# Patient Record
Sex: Female | Born: 1995 | Race: Black or African American | Hispanic: No | Marital: Single | Smoking: Never smoker
Health system: Southern US, Community
[De-identification: ages and names within clinical notes are randomized; demographics above are authoritative.]

## PROBLEM LIST (undated history)

## (undated) DIAGNOSIS — F3281 Premenstrual dysphoric disorder: Secondary | ICD-10-CM

## (undated) DIAGNOSIS — A6000 Herpesviral infection of urogenital system, unspecified: Secondary | ICD-10-CM

## (undated) DIAGNOSIS — E669 Obesity, unspecified: Secondary | ICD-10-CM

## (undated) DIAGNOSIS — Z8619 Personal history of other infectious and parasitic diseases: Secondary | ICD-10-CM

## (undated) HISTORY — DX: Herpesviral infection of urogenital system, unspecified: A60.00

## (undated) HISTORY — DX: Personal history of other infectious and parasitic diseases: Z86.19

---
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
## 2015-06-06 ENCOUNTER — Emergency Department (HOSPITAL_COMMUNITY)
Admission: EM | Admit: 2015-06-06 | Discharge: 2015-06-07 | Disposition: A | Discharge status: Home / Self Care | Payer: Medicaid Other | Attending: Emergency Medicine | Admitting: Emergency Medicine

## 2015-06-06 ENCOUNTER — Encounter (HOSPITAL_COMMUNITY): Payer: Self-pay

## 2015-06-06 DIAGNOSIS — Z3202 Encounter for pregnancy test, result negative: Secondary | ICD-10-CM | POA: Diagnosis not present

## 2015-06-06 DIAGNOSIS — R1110 Vomiting, unspecified: Secondary | ICD-10-CM | POA: Insufficient documentation

## 2015-06-06 DIAGNOSIS — J020 Streptococcal pharyngitis: Secondary | ICD-10-CM | POA: Diagnosis not present

## 2015-06-06 DIAGNOSIS — J029 Acute pharyngitis, unspecified: Secondary | ICD-10-CM | POA: Diagnosis present

## 2015-06-06 DIAGNOSIS — R109 Unspecified abdominal pain: Secondary | ICD-10-CM | POA: Insufficient documentation

## 2015-06-06 LAB — CBC
HCT: 43.5 % (ref 36.0–46.0)
Hemoglobin: 14.7 g/dL (ref 12.0–15.0)
MCH: 27.1 pg (ref 26.0–34.0)
MCHC: 33.8 g/dL (ref 30.0–36.0)
MCV: 80.1 fL (ref 78.0–100.0)
Platelets: 273 10*3/uL (ref 150–400)
RBC: 5.43 MIL/uL — ABNORMAL HIGH (ref 3.87–5.11)
RDW: 12.5 % (ref 11.5–15.5)
WBC: 5.6 10*3/uL (ref 4.0–10.5)

## 2015-06-06 LAB — URINE MICROSCOPIC-ADD ON

## 2015-06-06 LAB — COMPREHENSIVE METABOLIC PANEL
ALK PHOS: 53 U/L (ref 38–126)
ALT: 28 U/L (ref 14–54)
AST: 28 U/L (ref 15–41)
Albumin: 4.2 g/dL (ref 3.5–5.0)
Anion gap: 15 (ref 5–15)
BUN: 5 mg/dL — ABNORMAL LOW (ref 6–20)
CALCIUM: 9.9 mg/dL (ref 8.9–10.3)
CHLORIDE: 99 mmol/L — AB (ref 101–111)
CO2: 24 mmol/L (ref 22–32)
CREATININE: 0.69 mg/dL (ref 0.44–1.00)
GFR calc Af Amer: >60 mL/min (ref >60)
GFR calc non Af Amer: >60 mL/min (ref >60)
Glucose, Bld: 106 mg/dL — ABNORMAL HIGH (ref 65–99)
Potassium: 4.5 mmol/L (ref 3.5–5.1)
SODIUM: 138 mmol/L (ref 135–145)
Total Bilirubin: 0.7 mg/dL (ref 0.3–1.2)
Total Protein: 8.2 g/dL — ABNORMAL HIGH (ref 6.5–8.1)

## 2015-06-06 LAB — URINALYSIS, ROUTINE W REFLEX MICROSCOPIC
GLUCOSE, UA: NEGATIVE mg/dL
Ketones, ur: NEGATIVE mg/dL
Nitrite: NEGATIVE
PH: 6 (ref 5.0–8.0)
Protein, ur: NEGATIVE mg/dL
SPECIFIC GRAVITY, URINE: 1.023 (ref 1.005–1.030)

## 2015-06-06 LAB — POC URINE PREG, ED: PREG TEST UR: NEGATIVE

## 2015-06-06 LAB — LIPASE, BLOOD: Lipase: 21 U/L (ref 11–51)

## 2015-06-06 MED ORDER — ACETAMINOPHEN 500 MG PO TABS
1000.0000 mg | ORAL_TABLET | Freq: Once | ORAL | Status: AC
  Administered 2015-06-07: 1000 mg via ORAL
  Filled 2015-06-06: qty 2

## 2015-06-06 NOTE — ED Notes (Signed)
PA in room to assess patient's throat.

## 2015-06-06 NOTE — ED Provider Notes (Signed)
CSN: [id number]     Arrival date & time 06/06/15  2014 History   First MD Initiated Contact with Patient 06/06/15 2022     Chief Complaint  Patient presents with  . Emesis  . Sore Throat     (Consider location/radiation/quality/duration/timing/severity/associated sxs/prior Treatment) HPI Complains of sore throat and pain with swallowing and frontal headache gradual onset yesterday. She also complained of right upper quadrant pain today lasting one hour after eating an apple followed by vomiting 2 times. She is not nauseated presently she is presently hungry. Last bowel movement today., No diarrhea Other associated symptoms include mild cough. No shortness of breath. Nothing makes symptoms better or worse. Last menstrual period 3 weeks ago slightly lighter than normal. Denies vaginal discharge. History reviewed. No pertinent past medical history. History reviewed. No pertinent past surgical history. No family history on file. Social History  Substance Use Topics  . Smoking status: Never Smoker   . Smokeless tobacco: None  . Alcohol Use: None   OB History    No data available     Review of Systems  Constitutional: Negative.   HENT: Positive for sore throat.   Respiratory: Positive for cough.   Cardiovascular: Negative.   Gastrointestinal: Positive for vomiting and abdominal pain.  Musculoskeletal: Negative.   Skin: Negative.   Neurological: Negative.   Psychiatric/Behavioral: Negative.   All other systems reviewed and are negative.     Allergies  Review of patient's allergies indicates no known allergies.  Home Medications   Prior to Admission medications   Not on File   BP 139/77 mmHg  Pulse 95  Temp(Src) 98.4 F (36.9 C) (Oral)  Resp 22  SpO2 99%  LMP 05/16/2015 Physical Exam  Constitutional: She appears well-developed and well-nourished. No distress.  Laughing. No distress  HENT:  Head: Normocephalic and atraumatic.  Right Ear: External ear normal.  Left  Ear: External ear normal.  Mouth/Throat: No oropharyngeal exudate.  Oropharynx minimally reddened. No exudate uvula midline. Tonsils not large. Bilateral tympanic membranes normal no pain on manipulation of larynx  Eyes: Conjunctivae are normal. Pupils are equal, round, and reactive to light. Right eye exhibits no discharge. Left eye exhibits no discharge.  Neck: Neck supple. No tracheal deviation present. No thyromegaly present.  Cardiovascular: Normal rate and regular rhythm.   No murmur heard. Pulmonary/Chest: Effort normal and breath sounds normal.  Abdominal: Soft. Bowel sounds are normal. She exhibits no distension. There is no tenderness.  Morbidly obese  Musculoskeletal: Normal range of motion. She exhibits no edema or tenderness.  Lymphadenopathy:    She has no cervical adenopathy.  Neurological: She is alert. No cranial nerve deficit. She exhibits normal muscle tone. Coordination normal.  Gait normal  Skin: Skin is warm and dry. No rash noted.  Psychiatric: She has a normal mood and affect.  Nursing note and vitals reviewed.   ED Course  Procedures (including critical care time) Labs Review Labs Reviewed  COMPREHENSIVE METABOLIC PANEL - Abnormal; Notable for the following:    Chloride 99 (*)    Glucose, Bld 106 (*)    BUN 5 (*)    Total Protein 8.2 (*)    All other components within normal limits  CBC - Abnormal; Notable for the following:    RBC 5.43 (*)    All other components within normal limits  URINALYSIS, ROUTINE W REFLEX MICROSCOPIC (NOT AT [facility]) - Abnormal; Notable for the following:    Color, Urine AMBER (*)    APPearance CLOUDY (*)  Hgb urine dipstick TRACE (*)    Bilirubin Urine SMALL (*)    Leukocytes, UA SMALL (*)    All other components within normal limits  URINE MICROSCOPIC-ADD ON - Abnormal; Notable for the following:    Squamous Epithelial / LPF 6-30 (*)    Bacteria, UA FEW (*)    All other components within normal limits  LIPASE, BLOOD  POC  URINE PREG, ED    Imaging Review No results found. I have personally reviewed and evaluated these images and lab results as part of my medical decision-making.   EKG Interpretation None     12:55 AM patient states pain is improved after treatment with Tylenol. Results for orders placed or performed during the hospital encounter of 06/06/15  Rapid strep screen (not at [facility])  Result Value Ref Range   Streptococcus, Group A Screen (Direct) POSITIVE (A) NEGATIVE  Lipase, blood  Result Value Ref Range   Lipase 21 11 - 51 U/L  Comprehensive metabolic panel  Result Value Ref Range   Sodium 138 135 - 145 mmol/L   Potassium 4.5 3.5 - 5.1 mmol/L   Chloride 99 (L) 101 - 111 mmol/L   CO2 24 22 - 32 mmol/L   Glucose, Bld 106 (H) 65 - 99 mg/dL   BUN 5 (L) 6 - 20 mg/dL   Creatinine, Ser 1.610.69 0.44 - 1.00 mg/dL   Calcium 9.9 8.9 - 09.610.3 mg/dL   Total Protein 8.2 (H) 6.5 - 8.1 g/dL   Albumin 4.2 3.5 - 5.0 g/dL   AST 28 15 - 41 U/L   ALT 28 14 - 54 U/L   Alkaline Phosphatase 53 38 - 126 U/L   Total Bilirubin 0.7 0.3 - 1.2 mg/dL   GFR calc non Af Amer >60 >60 mL/min   GFR calc Af Amer >60 >60 mL/min   Anion gap 15 5 - 15  CBC  Result Value Ref Range   WBC 5.6 4.0 - 10.5 K/uL   RBC 5.43 (H) 3.87 - 5.11 MIL/uL   Hemoglobin 14.7 12.0 - 15.0 g/dL   HCT 04.543.5 40.936.0 - 81.146.0 %   MCV 80.1 78.0 - 100.0 fL   MCH 27.1 26.0 - 34.0 pg   MCHC 33.8 30.0 - 36.0 g/dL   RDW 91.412.5 78.211.5 - 95.615.5 %   Platelets 273 150 - 400 K/uL  Urinalysis, Routine w reflex microscopic (not at [facility])  Result Value Ref Range   Color, Urine AMBER (A) YELLOW   APPearance CLOUDY (A) CLEAR   Specific Gravity, Urine 1.023 1.005 - 1.030   pH 6.0 5.0 - 8.0   Glucose, UA NEGATIVE NEGATIVE mg/dL   Hgb urine dipstick TRACE (A) NEGATIVE   Bilirubin Urine SMALL (A) NEGATIVE   Ketones, ur NEGATIVE NEGATIVE mg/dL   Protein, ur NEGATIVE NEGATIVE mg/dL   Nitrite NEGATIVE NEGATIVE   Leukocytes, UA SMALL (A) NEGATIVE  Urine  microscopic-add on  Result Value Ref Range   Squamous Epithelial / LPF 6-30 (A) NONE SEEN   WBC, UA 0-5 0 - 5 WBC/hpf   RBC / HPF 0-5 0 - 5 RBC/hpf   Bacteria, UA FEW (A) NONE SEEN  POC Urine Pregnancy, ED (do NOT order at [facility])  Result Value Ref Range   Preg Test, Ur NEGATIVE NEGATIVE   No results found.  MDM  In light of positive rapid strep screen will treat with Pen-Vee K. Prescription written. First dose of penicillin administered prior to discharge. Plan Tylenol for pain. Referral to  [facility] to get primary care physician Diagnosis strep pharyngitis Final diagnoses:  None        Doug Sou, MD 06/07/15 0120

## 2015-06-06 NOTE — ED Notes (Signed)
Pt reports HA, sore throat and emesis x 2 today after eating an apple.

## 2015-06-06 NOTE — ED Notes (Signed)
Pt now reporting she thinks she might be pregnant and also reports abdominal pain.

## 2015-06-07 LAB — RAPID STREP SCREEN (MED CTR MEBANE ONLY): Streptococcus, Group A Screen (Direct): POSITIVE — AB

## 2015-06-07 MED ORDER — PENICILLIN V POTASSIUM 250 MG PO TABS
500.0000 mg | ORAL_TABLET | Freq: Once | ORAL | Status: AC
  Administered 2015-06-07: 500 mg via ORAL
  Filled 2015-06-07: qty 2

## 2015-06-07 MED ORDER — PENICILLIN V POTASSIUM 500 MG PO TABS: 500.0000 mg | ORAL_TABLET | Freq: Four times a day (QID) | ORAL | Status: AC

## 2015-06-07 NOTE — Discharge Instructions (Signed)
Pharyngitis Make sure that you finish the penicillin prescribed. Take Tylenol as directed for pain. [facility] to get a primary care physician. Return if you feel worse for any reason or are unable to hold down the medications prescribed without vomiting. Pharyngitis is redness, pain, and swelling (inflammation) of your pharynx.  CAUSES  Pharyngitis is usually caused by infection. Most of the time, these infections are from viruses (viral) and are part of a cold. However, sometimes pharyngitis is caused by bacteria (bacterial). Pharyngitis can also be caused by allergies. Viral pharyngitis may be spread from person to person by coughing, sneezing, and personal items or utensils (cups, forks, spoons, toothbrushes). Bacterial pharyngitis may be spread from person to person by more intimate contact, such as kissing.  SIGNS AND SYMPTOMS  Symptoms of pharyngitis include:   Sore throat.   Tiredness (fatigue).   Low-grade fever.   Headache.  Joint pain and muscle aches.  Skin rashes.  Swollen lymph nodes.  Plaque-like film on throat or tonsils (often seen with bacterial pharyngitis). DIAGNOSIS  Your health care provider will ask you questions about your illness and your symptoms. Your medical history, along with a physical exam, is often all that is needed to diagnose pharyngitis. Sometimes, a rapid strep test is done. Other lab tests may also be done, depending on the suspected cause.  TREATMENT  Viral pharyngitis will usually get better in 3-4 days without the use of medicine. Bacterial pharyngitis is treated with medicines that kill germs (antibiotics).  HOME CARE INSTRUCTIONS   Drink enough water and fluids to keep your urine clear or pale yellow.   Only take over-the-counter or prescription medicines as directed by your health care provider:   If you are prescribed antibiotics, make sure you finish them even if you start to feel better.   Do  not take aspirin.   Get lots of rest.   Gargle with 8 oz of salt water ( tsp of salt per 1 qt of water) as often as every 1-2 hours to soothe your throat.   Throat lozenges (if you are not at risk for choking) or sprays may be used to soothe your throat. SEEK MEDICAL CARE IF:   You have large, tender lumps in your neck.  You have a rash.  You cough up green, yellow-brown, or bloody spit. SEEK IMMEDIATE MEDICAL CARE IF:   Your neck becomes stiff.  You drool or are unable to swallow liquids.  You vomit or are unable to keep medicines or liquids down.  You have severe pain that does not go away with the use of recommended medicines.  You have trouble breathing (not caused by a stuffy nose). MAKE SURE YOU:   Understand these instructions.  Will watch your condition.  Will get help right away if you are not doing well or get worse.   This information is not intended to replace advice given to you by your health care provider. Make sure you discuss any questions you have with your health care provider.   Document Released: 03/22/2005 Document Revised: 01/10/2013 Document Reviewed: 11/27/2012 Elsevier Interactive Patient Education [employer]

## 2015-06-07 NOTE — ED Notes (Signed)
Pt departed in NAD.  

## 2015-10-07 ENCOUNTER — Ambulatory Visit (HOSPITAL_COMMUNITY)
Admission: EM | Admit: 2015-10-07 | Discharge: 2015-10-07 | Disposition: A | Discharge status: Home / Self Care | Payer: Medicaid Other | Attending: Family Medicine | Admitting: Family Medicine

## 2015-10-07 ENCOUNTER — Encounter (HOSPITAL_COMMUNITY): Payer: Self-pay | Admitting: Emergency Medicine

## 2015-10-07 DIAGNOSIS — J340 Abscess, furuncle and carbuncle of nose: Secondary | ICD-10-CM

## 2015-10-07 MED ORDER — CEPHALEXIN 500 MG PO CAPS: 500.0000 mg | ORAL_CAPSULE | Freq: Four times a day (QID) | ORAL | Status: DC

## 2015-10-07 NOTE — ED Provider Notes (Signed)
CSN: [id number]     Arrival date & time 10/07/15  1825 History   None    Chief Complaint  Patient presents with  . Foreign Body in Nose   (Consider location/radiation/quality/duration/timing/severity/associated sxs/prior Treatment) Patient is a 20 y.o. female presenting with foreign body in nose. The history is provided by the patient.  Foreign Body in Nose This is a new problem. The current episode started 2 days ago. The problem occurs constantly. The problem has not changed since onset.Nothing aggravates the symptoms. Nothing relieves the symptoms. She has tried nothing for the symptoms.    No past medical history on file. No past surgical history on file. No family history on file. Social History  Substance Use Topics  . Smoking status: Never Smoker   . Smokeless tobacco: Not on file  . Alcohol Use: Not on file   OB History    No data available     Review of Systems  Constitutional: Negative.   HENT: Negative.   Eyes: Negative.   Respiratory: Negative.   Cardiovascular: Negative.   Gastrointestinal: Negative.   Endocrine: Negative.   Genitourinary: Negative.   Musculoskeletal: Negative.   Skin: Positive for wound.  Allergic/Immunologic: Negative.   Neurological: Negative.   Hematological: Negative.   Psychiatric/Behavioral: Negative.     Allergies  Review of patient's allergies indicates no known allergies.  Home Medications   Prior to Admission medications   Not on File   Meds Ordered and Administered this Visit  Medications - No data to display  There were no vitals taken for this visit. No data found.   Physical Exam  Constitutional: She appears well-developed and well-nourished.  HENT:  Head: Normocephalic.  Right Ear: External ear normal.  Left Ear: External ear normal.  Mouth/Throat: Oropharynx is clear and moist.  Eyes: Conjunctivae are normal. Pupils are equal, round, and reactive to light.  Neck: Normal range of motion. Neck supple.   Cardiovascular: Normal rate, regular rhythm and normal heart sounds.   Pulmonary/Chest: Effort normal and breath sounds normal.  Skin:  Nose piercing metal piece removed from left nare.  Left nare with swelling and erythema and tenderness.    ED Course  Procedures (including critical care time)  Labs Review Labs Reviewed - No data to display  Imaging Review No results found.   Visual Acuity Review  Right Eye Distance:   Left Eye Distance:   Bilateral Distance:    Right Eye Near:   Left Eye Near:    Bilateral Near:         MDM   Nasal cellulitis - Keflex 500mg  one po qid x 10 days  Left nasal ring device is removed and patient advised not to replace the nose ring due to infection.  Deatra CanterWilliam J Oxford, FNP 10/07/15 [phone]

## 2015-10-07 NOTE — Discharge Instructions (Signed)
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
## 2016-08-12 ENCOUNTER — Emergency Department (HOSPITAL_COMMUNITY)
Admission: EM | Admit: 2016-08-12 | Discharge: 2016-08-12 | Disposition: A | Discharge status: Home / Self Care | Payer: No Typology Code available for payment source | Attending: Emergency Medicine | Admitting: Emergency Medicine

## 2016-08-12 ENCOUNTER — Emergency Department (HOSPITAL_COMMUNITY): Payer: No Typology Code available for payment source

## 2016-08-12 ENCOUNTER — Encounter (HOSPITAL_COMMUNITY): Payer: Self-pay | Admitting: Nurse Practitioner

## 2016-08-12 DIAGNOSIS — Y939 Activity, unspecified: Secondary | ICD-10-CM | POA: Insufficient documentation

## 2016-08-12 DIAGNOSIS — S3992XA Unspecified injury of lower back, initial encounter: Secondary | ICD-10-CM | POA: Diagnosis present

## 2016-08-12 DIAGNOSIS — S39012A Strain of muscle, fascia and tendon of lower back, initial encounter: Secondary | ICD-10-CM

## 2016-08-12 DIAGNOSIS — Y92410 Unspecified street and highway as the place of occurrence of the external cause: Secondary | ICD-10-CM | POA: Insufficient documentation

## 2016-08-12 DIAGNOSIS — Y999 Unspecified external cause status: Secondary | ICD-10-CM | POA: Diagnosis not present

## 2016-08-12 LAB — I-STAT BETA HCG BLOOD, ED (MC, WL, AP ONLY): I-stat hCG, quantitative: <5 m[IU]/mL (ref <5)

## 2016-08-12 MED ORDER — NAPROXEN 500 MG PO TABS: 500.0000 mg | ORAL_TABLET | Freq: Two times a day (BID) | ORAL | 0 refills | Status: DC

## 2016-08-12 MED ORDER — HYDROCODONE-ACETAMINOPHEN 5-325 MG PO TABS: 1.0000 | ORAL_TABLET | Freq: Four times a day (QID) | ORAL | 0 refills | Status: DC | PRN
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
## 2017-01-06 ENCOUNTER — Emergency Department (HOSPITAL_COMMUNITY)
Admission: EM | Admit: 2017-01-06 | Discharge: 2017-01-06 | Disposition: A | Discharge status: Home / Self Care | Payer: BLUE CROSS/BLUE SHIELD | Attending: Emergency Medicine | Admitting: Emergency Medicine

## 2017-01-06 ENCOUNTER — Encounter (HOSPITAL_COMMUNITY): Payer: Self-pay | Admitting: Emergency Medicine

## 2017-01-06 ENCOUNTER — Ambulatory Visit (HOSPITAL_COMMUNITY)
Admission: EM | Admit: 2017-01-06 | Discharge: 2017-01-06 | Disposition: A | Discharge status: Home / Self Care | Payer: BLUE CROSS/BLUE SHIELD

## 2017-01-06 DIAGNOSIS — R51 Headache: Secondary | ICD-10-CM

## 2017-01-06 DIAGNOSIS — G43009 Migraine without aura, not intractable, without status migrainosus: Secondary | ICD-10-CM

## 2017-01-06 DIAGNOSIS — G43909 Migraine, unspecified, not intractable, without status migrainosus: Secondary | ICD-10-CM | POA: Diagnosis not present

## 2017-01-06 DIAGNOSIS — R519 Headache, unspecified: Secondary | ICD-10-CM

## 2017-01-06 MED ORDER — PROCHLORPERAZINE EDISYLATE 5 MG/ML IJ SOLN
10.0000 mg | Freq: Once | INTRAMUSCULAR | Status: AC
  Administered 2017-01-06: 10 mg via INTRAVENOUS
  Filled 2017-01-06: qty 2

## 2017-01-06 MED ORDER — SODIUM CHLORIDE 0.9 % IV BOLUS (SEPSIS)
1000.0000 mL | Freq: Once | INTRAVENOUS | Status: AC
  Administered 2017-01-06: 1000 mL via INTRAVENOUS

## 2017-01-06 MED ORDER — KETOROLAC TROMETHAMINE 30 MG/ML IJ SOLN
30.0000 mg | Freq: Once | INTRAMUSCULAR | Status: AC
  Administered 2017-01-06: 30 mg via INTRAVENOUS
  Filled 2017-01-06: qty 1

## 2017-01-06 MED ORDER — DIPHENHYDRAMINE HCL 50 MG/ML IJ SOLN
25.0000 mg | Freq: Once | INTRAMUSCULAR | Status: AC
  Administered 2017-01-06: 25 mg via INTRAVENOUS
  Filled 2017-01-06: qty 1

## 2017-01-06 NOTE — ED Triage Notes (Signed)
Pt reports a headache that started at 1130 this morning.  After work she took a Midol and tried to lie down but she states she vomited not long after taking the medicine.  She reports some right eye blurriness when the headache started but that has subsided.  She states that her head is pounding.

## 2017-01-06 NOTE — ED Triage Notes (Signed)
Pt to ER from [facility] for further evaluation of headache. States started at 11:30 this morning, states primarily frontal headache with radiation to right eye, states earlier today experienced blurred vision and facial tingling. Pt at present time is neurologically intact, no deficits noted. NAD. Ambulatory.

## 2017-01-06 NOTE — ED Provider Notes (Signed)
[facility] Provider Note   CSN: [id number] Arrival date & time: 01/06/17  1650     History   Chief Complaint Chief Complaint  Patient presents with  . Headache    HPI Kelly Mcdaniel is a 21 y.o. female.  HPI Patient presents to the emergency department with migraine headache started earlier today.  The patient states she feels the headache, subjectively a little bit more intense than normal.  She states she did not take any medications other than Midol prior to arrival which she vomited shortly thereafter.  Patient states that nothing seems make the condition better or worse.  She states sounds earlier did make the headache, a little bit worse.  Patient states that she was seen at urgent care and they sent her here for further evaluation.  Patient denies blurred vision, nausea, weakness, dizziness, numbness, back pain, neck pain, chest pain, shortness of breath, abdominal pain or syncope History reviewed. No pertinent past medical history.  There are no active problems to display for this patient.   History reviewed. No pertinent surgical history.  OB History    No data available       Home Medications    Prior to Admission medications   Medication Sig Start Date End Date Taking? Authorizing Provider  Acetaminophen-Caff-Pyrilamine (MIDOL COMPLETE) 500-60-15 MG TABS Take 1-2 tablets by mouth every 6 (six) hours as needed (for pain or discomfort).   Yes [provider]  cephALEXin (KEFLEX) 500 MG capsule Take 1 capsule (500 mg total) by mouth 4 (four) times daily. Patient not taking: Reported on 01/06/2017 10/07/15   Deatra Canter, FNP  HYDROcodone-acetaminophen (NORCO/VICODIN) 5-325 MG tablet Take 1-2 tablets by mouth every 6 (six) hours as needed for moderate pain. Patient not taking: Reported on 01/06/2017 08/12/16   Vanetta Mulders, MD  naproxen (NAPROSYN) 500 MG tablet Take 1 tablet (500 mg total) by mouth 2 (two) times daily. Patient not taking:  Reported on 01/06/2017 08/12/16   Vanetta Mulders, MD    Family History No family history on file.  Social History Social History  Substance Use Topics  . Smoking status: Never Smoker  . Smokeless tobacco: Never Used  . Alcohol use Yes     Allergies   Patient has no known allergies.   Review of Systems Review of Systems  All other systems negative except as documented in the HPI. All pertinent positives and negatives as reviewed in the HPI. Physical Exam Updated Vital Signs BP 111/76   Pulse 73   Temp 98.3 F (36.8 C) (Oral)   Resp 17   LMP 12/22/2016 (Approximate)   SpO2 100%   Physical Exam  Constitutional: She is oriented to person, place, and time. She appears well-developed and well-nourished. No distress.  HENT:  Head: Normocephalic and atraumatic.  Mouth/Throat: Oropharynx is clear and moist.  Eyes: Pupils are equal, round, and reactive to light.  Neck: Normal range of motion. Neck supple.  Cardiovascular: Normal rate, regular rhythm and normal heart sounds.  Exam reveals no gallop and no friction rub.   No murmur heard. Pulmonary/Chest: Effort normal and breath sounds normal. No respiratory distress. She has no wheezes.  Neurological: She is alert and oriented to person, place, and time. She has normal strength. No sensory deficit. She exhibits normal muscle tone. Coordination and gait normal.  Skin: Skin is warm and dry. Capillary refill takes less than 2 seconds. No rash noted. No erythema.  Psychiatric: She has a normal mood and affect. Her behavior  is normal.  Nursing note and vitals reviewed.    ED Treatments / Results  Labs (all labs ordered are listed, but only abnormal results are displayed) Labs Reviewed - No data to display  EKG  EKG Interpretation None       Radiology No results found.  Procedures Procedures (including critical care time)  Medications Ordered in ED Medications  ketorolac (TORADOL) 30 MG/ML injection 30 mg (30 mg  Intravenous Given 01/06/17 2142)  diphenhydrAMINE (BENADRYL) injection 25 mg (25 mg Intravenous Given 01/06/17 2142)  prochlorperazine (COMPAZINE) injection 10 mg (10 mg Intravenous Given 01/06/17 2142)  sodium chloride 0.9 % bolus 1,000 mL (0 mLs Intravenous Stopped 01/06/17 2240)     Initial Impression / Assessment and Plan / ED Course  I have reviewed the triage vital signs and the nursing notes.  Pertinent labs & imaging results that were available during my care of the patient were reviewed by me and considered in my medical decision making (see chart for details).   .  I feel that the patient's symptoms are being caused by migraine headache based on her history of present illness and physical exam findings.  Patient states that she is feeling better at this time following IV medications and IV fluids.  Patient is advised return here as needed.  I did advise her to follow-up with her primary care doctor.  She has no neurological deficits noted on exam. Final Clinical Impressions(s) / ED Diagnoses   Final diagnoses:  None    New Prescriptions New Prescriptions   No medications on file     Charlestine Night, Cordelia Poche 01/06/17 2311    Tegeler, Canary Brim, MD 01/06/17 2322

## 2017-01-06 NOTE — Discharge Instructions (Signed)
Return here as needed.  Follow-up with your primary doctor.  Return here as needed.

## 2017-01-06 NOTE — ED Provider Notes (Signed)
[facility]    CSN: [id number] Arrival date & time: 01/06/17  1438     History   Chief Complaint Chief Complaint  Patient presents with  . Headache    HPI Kelly Mcdaniel is a 21 y.o. female.   HPI: 21 y/o female presented to clinic with CC of headache which started this morning. Pt reports headache associated with blurry vision in rt eye and rt sided facial numbness. Blurry vision lasted for about 40-45 minutes and resolved, numbness for 3-5 min. Pt had one episode o vomit proceeding headache. Denies photo or phono phobia.  Pt states worst headache of life. Neurologically Intact, speech clear, gait steady. No neurological deficit appreciated. Pt A&Ox3. History provided by Pt.  History reviewed. No pertinent past medical history.  There are no active problems to display for this patient.   History reviewed. No pertinent surgical history.  OB History    No data available       Home Medications    Prior to Admission medications   Medication Sig Start Date End Date Taking? Authorizing Provider  cephALEXin (KEFLEX) 500 MG capsule Take 1 capsule (500 mg total) by mouth 4 (four) times daily. 10/07/15   Deatra Canter, FNP  HYDROcodone-acetaminophen (NORCO/VICODIN) 5-325 MG tablet Take 1-2 tablets by mouth every 6 (six) hours as needed for moderate pain. 08/12/16   Vanetta Mulders, MD  naproxen (NAPROSYN) 500 MG tablet Take 1 tablet (500 mg total) by mouth 2 (two) times daily. 08/12/16   Vanetta Mulders, MD    Family History History reviewed. No pertinent family history.  Social History Social History  Substance Use Topics  . Smoking status: Never Smoker  . Smokeless tobacco: Never Used  . Alcohol use Yes     Allergies   Patient has no known allergies.   Review of Systems Review of Systems  Constitutional: Negative.   HENT: Negative.   Eyes: Negative.   Respiratory: Negative.   Cardiovascular: Negative.   Neurological: Positive for headaches.  Negative for dizziness, tremors, seizures, syncope, facial asymmetry, speech difficulty, weakness and numbness.     Physical Exam Triage Vital Signs ED Triage Vitals  Enc Vitals Group     BP 01/06/17 1537 130/79     Pulse Rate 01/06/17 1537 79     Resp --      Temp 01/06/17 1537 98.2 F (36.8 C)     Temp Source 01/06/17 1537 Oral     SpO2 01/06/17 1537 100 %     Weight --      Height --      Head Circumference --      Peak Flow --      Pain Score 01/06/17 1538 10     Pain Loc --      Pain Edu? --      Excl. in GC? --    No data found.   Updated Vital Signs BP 130/79 (BP Location: Left Arm)   Pulse 79   Temp 98.2 F (36.8 C) (Oral)   LMP 12/22/2016 (Approximate)   SpO2 100%   Visual Acuity Right Eye Distance:   Left Eye Distance:   Bilateral Distance:    Right Eye Near:   Left Eye Near:    Bilateral Near:     Physical Exam  Constitutional: She is oriented to person, place, and time. She appears well-developed and well-nourished. No distress.  HENT:  Head: Normocephalic.  Eyes: Pupils are equal, round, and reactive to light. Conjunctivae and  EOM are normal.  Neck: Normal range of motion.  Cardiovascular: Normal rate, regular rhythm and normal heart sounds.   Pulmonary/Chest: Effort normal and breath sounds normal.  Musculoskeletal: Normal range of motion.  Neurological: She is alert and oriented to person, place, and time. No cranial nerve deficit or sensory deficit. Coordination normal.  Skin: Skin is warm. She is not diaphoretic.     UC Treatments / Results  Labs (all labs ordered are listed, but only abnormal results are displayed) Labs Reviewed - No data to display  EKG  EKG Interpretation None       Radiology No results found.  Procedures Procedures (including critical care time)  Medications Ordered in UC Medications - No data to display   Initial Impression / Assessment and Plan / UC Course  I have reviewed the triage vital signs  and the nursing notes.  Pertinent labs & imaging results that were available during my care of the patient were reviewed by me and considered in my medical decision making (see chart for details).    Pt referred to ED for further evaluation and management.   Final Clinical Impressions(s) / UC Diagnoses   Final diagnoses:  Acute intractable headache, unspecified headache type    New Prescriptions New Prescriptions   No medications on file     Controlled Substance Prescriptions [place] Controlled Substance Registry consulted? No   Staisha Winiarski, NP 01/06/17 1640

## 2017-01-06 NOTE — Discharge Instructions (Signed)
Pt to ED for further evaluation and management  °

## 2017-02-22 ENCOUNTER — Ambulatory Visit: Payer: Self-pay | Admitting: Family Medicine

## 2017-06-08 ENCOUNTER — Ambulatory Visit (HOSPITAL_COMMUNITY)
Admission: EM | Admit: 2017-06-08 | Discharge: 2017-06-08 | Disposition: A | Discharge status: Home / Self Care | Payer: BLUE CROSS/BLUE SHIELD | Attending: Family Medicine | Admitting: Family Medicine

## 2017-06-08 ENCOUNTER — Encounter (HOSPITAL_COMMUNITY): Payer: Self-pay | Admitting: Emergency Medicine

## 2017-06-08 DIAGNOSIS — R300 Dysuria: Secondary | ICD-10-CM

## 2017-06-08 DIAGNOSIS — N3001 Acute cystitis with hematuria: Secondary | ICD-10-CM | POA: Diagnosis not present

## 2017-06-08 DIAGNOSIS — N390 Urinary tract infection, site not specified: Secondary | ICD-10-CM | POA: Diagnosis not present

## 2017-06-08 DIAGNOSIS — Z8619 Personal history of other infectious and parasitic diseases: Secondary | ICD-10-CM | POA: Diagnosis not present

## 2017-06-08 DIAGNOSIS — Z113 Encounter for screening for infections with a predominantly sexual mode of transmission: Secondary | ICD-10-CM | POA: Diagnosis not present

## 2017-06-08 DIAGNOSIS — A6009 Herpesviral infection of other urogenital tract: Secondary | ICD-10-CM | POA: Diagnosis not present

## 2017-06-08 LAB — POCT URINALYSIS DIP (DEVICE)
Glucose, UA: NEGATIVE mg/dL
Ketones, ur: 15 mg/dL — AB
Leukocytes, UA: NEGATIVE
Nitrite: NEGATIVE
Protein, ur: 100 mg/dL — AB
SPECIFIC GRAVITY, URINE: 1.025 (ref 1.005–1.030)
Urobilinogen, UA: 4 mg/dL — ABNORMAL HIGH (ref 0.0–1.0)
pH: 6.5 (ref 5.0–8.0)

## 2017-06-08 LAB — POCT PREGNANCY, URINE: Preg Test, Ur: NEGATIVE

## 2017-06-08 MED ORDER — NITROFURANTOIN MONOHYD MACRO 100 MG PO CAPS: 100.0000 mg | ORAL_CAPSULE | Freq: Two times a day (BID) | ORAL | 0 refills | Status: AC

## 2017-06-08 NOTE — Discharge Instructions (Signed)
We are treating you with Macrobid- every 12 hours for 5 days. Be sure to take full course. Stay hydrated- urine should be pale yellow to clear. My continue azo for relief of burning while infection is being cleared.   We are sending urine off to confirm UTI and caginal swab for STD's/Yeast/BV as cause of irritation.  Please return or follow up with your primary provider if symptoms not improving with treatment. Please return sooner if you have worsening of symptoms or develop fever, nausea, vomiting, abdominal pain, back pain, lightheadedness, dizziness.

## 2017-06-08 NOTE — ED Triage Notes (Signed)
PT reports dysuria since Sunday.  

## 2017-06-08 NOTE — ED Provider Notes (Signed)
[facility]    CSN: [id number] Arrival date & time: 06/08/17  1247     History   Chief Complaint Chief Complaint  Patient presents with  . Urinary Tract Infection    HPI Kelly Mcdaniel is a 22 y.o. female no contributing past medical history presenting today with dysuria.  Patient has had irritation with urinating, increased frequency, sensation of incomplete voiding for 3 days.  She took Azo on Monday and Tuesday.  She denies any vaginal discharge, vaginal irritation/itching or odor.  She states that the burning sensation comes from inside the urethra and not on the outside.  She denies fever, nausea, vomiting, abdominal pain.  She is having lower back pain.  Denies history of kidney stones.  Also is noting minor bleeding with wiping near urethra.  Last menstrual period was around February 20.  She is not on birth control.  Patient is sexually active, denies any new partners.  HPI  History reviewed. No pertinent past medical history.  There are no active problems to display for this patient.   History reviewed. No pertinent surgical history.  OB History    No data available       Home Medications    Prior to Admission medications   Medication Sig Start Date End Date Taking? Authorizing Provider  Acetaminophen-Caff-Pyrilamine (MIDOL COMPLETE) 500-60-15 MG TABS Take 1-2 tablets by mouth every 6 (six) hours as needed (for pain or discomfort).    [provider]  cephALEXin (KEFLEX) 500 MG capsule Take 1 capsule (500 mg total) by mouth 4 (four) times daily. Patient not taking: Reported on 01/06/2017 10/07/15   Deatra Canterxford, William J, FNP  HYDROcodone-acetaminophen (NORCO/VICODIN) 5-325 MG tablet Take 1-2 tablets by mouth every 6 (six) hours as needed for moderate pain. Patient not taking: Reported on 01/06/2017 08/12/16   Vanetta MuldersZackowski, Scott, MD  naproxen (NAPROSYN) 500 MG tablet Take 1 tablet (500 mg total) by mouth 2 (two) times daily. Patient not taking: Reported on  01/06/2017 08/12/16   Vanetta MuldersZackowski, Scott, MD  nitrofurantoin, macrocrystal-monohydrate, (MACROBID) 100 MG capsule Take 1 capsule (100 mg total) by mouth 2 (two) times daily for 5 days. 06/08/17 06/13/17  Baylynn Shifflett, Junius CreamerHallie C, PA-C    Family History No family history on file.  Social History Social History   Tobacco Use  . Smoking status: Never Smoker  . Smokeless tobacco: Never Used  Substance Use Topics  . Alcohol use: Yes  . Drug use: No     Allergies   Patient has no known allergies.   Review of Systems Review of Systems  Constitutional: Negative for fever.  Respiratory: Negative for shortness of breath.   Cardiovascular: Negative for chest pain.  Gastrointestinal: Negative for abdominal pain, diarrhea, nausea and vomiting.  Genitourinary: Positive for dysuria and frequency. Negative for flank pain, genital sores, hematuria, menstrual problem, vaginal bleeding, vaginal discharge and vaginal pain.  Musculoskeletal: Negative for back pain.  Skin: Negative for rash.  Neurological: Negative for dizziness, light-headedness and headaches.     Physical Exam Triage Vital Signs ED Triage Vitals  Enc Vitals Group     BP 06/08/17 1316 121/78     Pulse Rate 06/08/17 1315 94     Resp 06/08/17 1315 16     Temp 06/08/17 1315 98.7 F (37.1 C)     Temp Source 06/08/17 1315 Oral     SpO2 06/08/17 1315 98 %     Weight 06/08/17 1314 280 lb (127 kg)     Height --  Head Circumference --      Peak Flow --      Pain Score 06/08/17 1314 8     Pain Loc --      Pain Edu? --      Excl. in GC? --    No data found.  Updated Vital Signs BP 121/78   Pulse 94   Temp 98.7 F (37.1 C) (Oral)   Resp 16   Wt 280 lb (127 kg)   LMP 05/25/2017   SpO2 98%   Visual Acuity Right Eye Distance:   Left Eye Distance:   Bilateral Distance:    Right Eye Near:   Left Eye Near:    Bilateral Near:     Physical Exam  Constitutional: She appears well-developed and well-nourished. No distress.    HENT:  Head: Normocephalic and atraumatic.  Eyes: Conjunctivae are normal.  Neck: Neck supple.  Cardiovascular: Normal rate and regular rhythm.  No murmur heard. Pulmonary/Chest: Effort normal and breath sounds normal. No respiratory distress.  Breathing comfortably at rest  Abdominal: Soft. There is no tenderness.  Abdomen is soft, nontender to light and deep palpation.  No CVA tenderness.  Genitourinary:  Genitourinary Comments: Pelvic exam deferred  Musculoskeletal: She exhibits no edema.  Neurological: She is alert.  Skin: Skin is warm and dry.  Psychiatric: She has a normal mood and affect.  Nursing note and vitals reviewed.    UC Treatments / Results  Labs (all labs ordered are listed, but only abnormal results are displayed) Labs Reviewed  POCT URINALYSIS DIP (DEVICE) - Abnormal; Notable for the following components:      Result Value   Bilirubin Urine SMALL (*)    Ketones, ur 15 (*)    Hgb urine dipstick MODERATE (*)    Protein, ur 100 (*)    Urobilinogen, UA 4.0 (*)    All other components within normal limits  URINE CULTURE  POCT PREGNANCY, URINE  CERVICOVAGINAL ANCILLARY ONLY    EKG  EKG Interpretation None       Radiology No results found.  Procedures Procedures (including critical care time)  Medications Ordered in UC Medications - No data to display   Initial Impression / Assessment and Plan / UC Course  I have reviewed the triage vital signs and the nursing notes.  Pertinent labs & imaging results that were available during my care of the patient were reviewed by me and considered in my medical decision making (see chart for details).     UA was negative leuks and nitrites, other abnormalities.  Patient has taken Azo.  Given Azo excuse the UA, will go ahead and treat given classic symptoms of UTI and lacking vaginal symptoms.  Did discuss with patient that vaginal issues may also cause urination issues.  Will obtain vaginal swab to check  for STDs/yeast/BV.   Macrobid prescribed, culture sent off to confirm UTI. Discussed strict return precautions. Patient verbalized understanding and is agreeable with plan.   Final Clinical Impressions(s) / UC Diagnoses   Final diagnoses:  Dysuria    ED Discharge Orders        Ordered    nitrofurantoin, macrocrystal-monohydrate, (MACROBID) 100 MG capsule  2 times daily     06/08/17 1419       Controlled Substance Prescriptions [place] Controlled Substance Registry consulted? Not Applicable   Lew Dawes, [place] 06/08/17 1425

## 2017-06-09 LAB — URINE CULTURE: CULTURE: NO GROWTH

## 2017-06-11 ENCOUNTER — Telehealth (HOSPITAL_COMMUNITY): Payer: Self-pay | Admitting: Internal Medicine

## 2017-06-11 DIAGNOSIS — A549 Gonococcal infection, unspecified: Secondary | ICD-10-CM | POA: Diagnosis not present

## 2017-06-11 DIAGNOSIS — A749 Chlamydial infection, unspecified: Secondary | ICD-10-CM | POA: Diagnosis not present

## 2017-06-11 LAB — CERVICOVAGINAL ANCILLARY ONLY
BACTERIAL VAGINITIS: POSITIVE — AB
Candida vaginitis: NEGATIVE
Chlamydia: POSITIVE — AB
NEISSERIA GONORRHEA: NEGATIVE
Trichomonas: NEGATIVE

## 2017-06-11 MED ORDER — AZITHROMYCIN 500 MG PO TABS
1000.0000 mg | ORAL_TABLET | Freq: Once | ORAL | 0 refills | Status: AC
Start: 2017-06-11 — End: 2017-06-11

## 2017-06-11 NOTE — Telephone Encounter (Signed)
Clinical staff, please let patient and the health department know that test for chlamydia was positive.  Prescription for zithromax sent to the pharmacy of record, [facility] on [address].  Please refrain from sexual intercourse for 7 days to give the medicine time to work.  Sexual partners need to be notified and tested/treated.  Condoms may reduce risk of reinfection.   Test for gardnerella (bacterial vaginosis) was also positive.  This only needs to be treated if there are persistent symptoms, such as vaginal irritation/discharge.   If these symptoms are present, ok to send rx for metronidazole 500mg  bid x 7d #14 no refills or metronidazole vaginal gel 0.75% 1 applicatorful bid x 7d #14 no refills.  Please also refer to result note of 3/8 regarding urine culture negative for UTI. Recheck or followup with PCP for further evaluation if symptoms are not improving.   LM

## 2017-12-03 DIAGNOSIS — R350 Frequency of micturition: Secondary | ICD-10-CM | POA: Diagnosis not present

## 2018-02-09 ENCOUNTER — Encounter: Payer: Self-pay | Admitting: Family Medicine

## 2018-02-09 ENCOUNTER — Ambulatory Visit (INDEPENDENT_AMBULATORY_CARE_PROVIDER_SITE_OTHER): Payer: BLUE CROSS/BLUE SHIELD | Admitting: Family Medicine

## 2018-02-09 VITALS — BP 120/78 | HR 68 | Temp 98.8°F | Resp 18 | Ht 66.0 in | Wt 271.0 lb

## 2018-02-09 DIAGNOSIS — N946 Dysmenorrhea, unspecified: Secondary | ICD-10-CM

## 2018-02-09 DIAGNOSIS — Z6841 Body Mass Index (BMI) 40.0 and over, adult: Secondary | ICD-10-CM | POA: Diagnosis not present

## 2018-02-09 DIAGNOSIS — F3281 Premenstrual dysphoric disorder: Secondary | ICD-10-CM | POA: Diagnosis not present

## 2018-02-09 MED ORDER — BUPROPION HCL ER (XL) 150 MG PO TB24: 150.0000 mg | ORAL_TABLET | Freq: Every day | ORAL | 2 refills | Status: DC

## 2018-02-09 NOTE — Progress Notes (Signed)
Kelly Mcdaniel, is a 22 y.o. female  WUJ:[id number]  [id number]  DOB - 04-30-1995  CC:  Chief Complaint  Patient presents with  . Establish Care       HPI: Kelly Mcdaniel is a 22 y.o. female is here today to establish care.   Kelly Mcdaniel does not have a problem list on file.    Today's visit:  Kelly Mcdaniel is here today to establish care.concern today regarding some menstrual irregularity and lower pelvic cramping. She reports lower pelvic cramping occurring each.  However of recent  cramping has become painful to the point that she has missed work and it has impaired her normal daily activities. It is not relieved with ibuprofen Tylenol and she has to lay down and rests to find comfort.  Reports that her. Length has been irregular where she has had some periods last for 3 days and others last for up to 6 days. In addition to pain she complains of some mood irregularity during this period of time. She notes increased crying, irritability, and anger mostly toward her partner starting at least 2-3 weeks prior to her period.  She reports these mood swings have gotten to the point that she is wanting to start some medication for depression and anxiety. Reports a history of depression although has never received treatment.  Denies suicidal ideation, homicidal ideations, or auditory hallucinations.  Current medications:No current outpatient medications on file.   Pertinent family medical history: family history includes Diabetes in her father; Healthy in her brother, sister, and sister; Hyperlipidemia in her father; Neurologic Disorder in her paternal grandfather.   No Known Allergies  Social History   Socioeconomic History  . Marital status: Single    Spouse name: Not on file  . Number of children: Not on file  . Years of education: Not on file  . Highest education level: Not on file  Occupational History  . Not on file  Social Needs  . Financial resource strain: Not on file  . Food  insecurity:    Worry: Not on file    Inability: Not on file  . Transportation needs:    Medical: Not on file    Non-medical: Not on file  Tobacco Use  . Smoking status: Never Smoker  . Smokeless tobacco: Never Used  Substance and Sexual Activity  . Alcohol use: Yes  . Drug use: No  . Sexual activity: Yes  Lifestyle  . Physical activity:    Days per week: Not on file    Minutes per session: Not on file  . Stress: Not on file  Relationships  . Social connections:    Talks on phone: Not on file    Gets together: Not on file    Attends religious service: Not on file    Active member of club or organization: Not on file    Attends meetings of clubs or organizations: Not on file    Relationship status: Not on file  . Intimate partner violence:    Fear of current or ex partner: Not on file    Emotionally abused: Not on file    Physically abused: Not on file    Forced sexual activity: Not on file  Other Topics Concern  . Not on file  Social History Narrative  . Not on file    Review of Systems: Constitutional: Negative for fever, chills, diaphoresis, activity change, appetite change and fatigue. Eyes: Negative for pain, discharge, redness, itching and visual disturbance. Respiratory: Negative for cough, choking, chest  tightness, shortness of breath, wheezing and stridor.  Cardiovascular: Negative for chest pain, palpitations and leg swelling. Gastrointestinal: Negative for abdominal distention. Genitourinary: Pelvic pain and irregular menstrual cycle  Hematological: Negative for adenopathy. Does not bruise/bleed easily. Psychiatric/Behavioral: Has a different intermittent dysphoric mood, worry, and anxiousness   Objective:   Vitals:   02/09/18 1359  BP: 120/78  Pulse: 68  Resp: 18  Temp: 98.8 F (37.1 C)  SpO2: 100%    BP Readings from Last 3 Encounters:  02/09/18 120/78  06/08/17 121/78  01/06/17 (!) 92/47    Filed Weights   02/09/18 1359  Weight: 271 lb  (122.9 kg)      Physical Exam: Constitutional: Patient appears well-developed and well-nourished. No distress. HENT: Normocephalic, atraumatic, External right and left ear normal. Oropharynx is clear and moist.  Eyes: Conjunctivae and EOM are normal. PERRLA, no scleral icterus. Neck: Normal ROM. Neck supple. No JVD. No tracheal deviation. No thyromegaly. CVS: RRR, S1/S2 +, no murmurs, no gallops, no carotid bruit.  Pulmonary: Effort and breath sounds normal, no stridor, rhonchi, wheezes, rales.  Abdominal: Soft. BS +, no distension, tenderness, rebound or guarding.  Musculoskeletal: Normal range of motion. No edema and no tenderness.  Neuro: Alert. Normal muscle tone coordination. Normal gait. BUE and BLE strength 5/5.  Skin: Skin is warm and dry. No rash noted. Not diaphoretic. No erythema. No pallor. Psychiatric: Normal mood and affect. Behavior, judgment, thought content normal.  Lab Results (prior encounters)  Lab Results  Component Value Date   WBC 5.6 06/06/2015   HGB 14.7 06/06/2015   HCT 43.5 06/06/2015   MCV 80.1 06/06/2015   PLT 273 06/06/2015   Lab Results  Component Value Date   CREATININE 0.69 06/06/2015   BUN 5 (L) 06/06/2015   NA 138 06/06/2015   K 4.5 06/06/2015   CL 99 (L) 06/06/2015   CO2 24 06/06/2015   Assessment and plan:  1. PMDD (premenstrual dysphoric disorder), suspect given history of underlying depression this is likely premenstrual dysphoric disorder due to mood swings.  Will trial Wellbutrin 150 mg once daily.  Will titrate up if desired effect is not needed at this current dose.  Will return for follow-up in 6 weeks.  2. Painful menstrual periods, Kelly Mcdaniel transvaginal ultrasound to rule out fibroids or ovarian cyst. - US Pelvic Complete With Transvaginal; Future  3. BMI 40.0-44.9, adult (HCC) Encouraged efforts to reduce weight include engaging in physical activity as tolerated with goal of 150 minutes per week. Improve dietary choices and eat a  meal regimen consistent with a Mediterranean or DASH diet. Reduce simple carbohydrates. Do not skip meals and eat healthy snacks throughout the day to avoid over-eating at dinner. Set a goal weight loss that is achievable for you. -Opt Wellbutrin today 150 mg once daily for MDD this will likely this with some appetite suppression and may facilitate some weight loss.  Orders Placed This Encounter  Procedures  . US Pelvic Complete With Transvaginal    Standing Status:   Future    Standing Expiration Date:   04/12/2019    Order Specific Question:   Reason for Exam (SYMPTOM  OR DIAGNOSIS REQUIRED)    Answer:   painful periods and pelvic cramping greater than 1 year    Order Specific Question:   Preferred imaging location?    Answer:   [facility]    Order Specific Question:   Call Results- Best Contact Number?    Answer:   [phone]  Meds ordered this encounter  Medications  . buPROPion (WELLBUTRIN XL) 150 MG 24 hr tablet    Sig: Take 1 tablet (150 mg total) by mouth daily.    Dispense:  30 tablet    Refill:  2    Return in about 6 weeks (around 03/23/2018), or medication managment -fasting labs 1-2 weeks.  A total of 30 minutes spent, greater than 50 % of this time was spent counseling and coordination of care.   The patient was given clear instructions to go to ER or return to medical center if symptoms don't improve, worsen or new problems develop. The patient verbalized understanding. The patient was advised  to call and obtain lab results if they haven't heard anything from out office within 7-10 business days.  Joaquin Courts, FNP [facility] [address] [place] [address] [phone]: [phone]    This note has been created with Dragon speech recognition software and Paediatric nurse. Any transcriptional errors are unintentional.

## 2018-02-09 NOTE — Patient Instructions (Addendum)
Thank you for choosing Primary Care at [facility] for your medical home!    Kelly Mcdaniel was seen by Joaquin Courts, FNP today.   Kelly Mcdaniel's primary care doctor is Bing Neighbors, FNP.   For the best care possible,  you should try to see Joaquin Courts, FNP-C  whenever you come to clinic.   We look forward to seeing you again soon!  If you have any questions about your visit today,  please call us at [phone]  Or feel free to reach your provider via MyChart.     Bupropion sustained-release tablets (Depression/Mood Disorders) What is this medicine? BUPROPION (byoo PROE pee on) is used to treat depression. This medicine may be used for other purposes; ask your health care provider or pharmacist if you have questions. COMMON BRAND NAME(S): Budeprion SR, Wellbutrin SR What should I tell my health care provider before I take this medicine? They need to know if you have any of these conditions: -an eating disorder, such as anorexia or bulimia -bipolar disorder or psychosis -diabetes or high blood sugar, treated with medication -glaucoma -head injury or brain tumor -heart disease, previous heart attack, or irregular heart beat -high blood pressure -kidney or liver disease -seizures -suicidal thoughts or a previous suicide attempt -Tourette's syndrome -weight loss -an unusual or allergic reaction to bupropion, other medicines, foods, dyes, or preservatives -breast-feeding -pregnant or trying to become pregnant How should I use this medicine? Take this medicine by mouth with a glass of water. Follow the directions on the prescription label. You can take it with or without food. If it upsets your stomach, take it with food. Do not cut, crush or chew this medicine. Take your medicine at regular intervals. If you take this medicine more than once a day, take your second dose at least 8 hours after you take your first dose. To limit difficulty in sleeping, avoid  taking this medicine at bedtime. Do not take your medicine more often than directed. Do not stop taking this medicine suddenly except upon the advice of your doctor. Stopping this medicine too quickly may cause serious side effects or your condition may worsen. A special MedGuide will be given to you by the pharmacist with each prescription and refill. Be sure to read this information carefully each time. Talk to your pediatrician regarding the use of this medicine in children. Special care may be needed. Overdosage: If you think you have taken too much of this medicine contact a poison control center or emergency room at once. NOTE: This medicine is only for you. Do not share this medicine with others. What if I miss a dose? If you miss a dose, skip the missed dose and take your next tablet at the regular time. There should be at least 8 hours between doses. Do not take double or extra doses. What may interact with this medicine? Do not take this medicine with any of the following medications: -linezolid -MAOIs like Azilect, Carbex, Eldepryl, Marplan, Nardil, and Parnate -methylene blue (injected into a vein) -other medicines that contain bupropion like Zyban This medicine may also interact with the following medications: -alcohol -certain medicines for anxiety or sleep -certain medicines for blood pressure like metoprolol, propranolol -certain medicines for depression or psychotic disturbances -certain medicines for HIV or AIDS like efavirenz, lopinavir, nelfinavir, ritonavir -certain medicines for irregular heart beat like propafenone, flecainide -certain medicines for Parkinson's disease like amantadine, levodopa -certain medicines for seizures like carbamazepine, phenytoin, phenobarbital -cimetidine -clopidogrel -cyclophosphamide -digoxin -furazolidone -  isoniazid -nicotine -orphenadrine -procarbazine -steroid medicines like prednisone or cortisone -stimulant medicines for  attention disorders, weight loss, or to stay awake -tamoxifen -theophylline -thiotepa -ticlopidine -tramadol -warfarin This list may not describe all possible interactions. Give your health care provider a list of all the medicines, herbs, non-prescription drugs, or dietary supplements you use. Also tell them if you smoke, drink alcohol, or use illegal drugs. Some items may interact with your medicine. What should I watch for while using this medicine? Tell your doctor if your symptoms do not get better or if they get worse. Visit your doctor or health care professional for regular checks on your progress. Because it may take several weeks to see the full effects of this medicine, it is important to continue your treatment as prescribed by your doctor. Patients and their families should watch out for new or worsening thoughts of suicide or depression. Also watch out for sudden changes in feelings such as feeling anxious, agitated, panicky, irritable, hostile, aggressive, impulsive, severely restless, overly excited and hyperactive, or not being able to sleep. If this happens, especially at the beginning of treatment or after a change in dose, call your health care professional. Avoid alcoholic drinks while taking this medicine. Drinking excessive alcoholic beverages, using sleeping or anxiety medicines, or quickly stopping the use of these agents while taking this medicine may increase your risk for a seizure. Do not drive or use heavy machinery until you know how this medicine affects you. This medicine can impair your ability to perform these tasks. Do not take this medicine close to bedtime. It may prevent you from sleeping. Your mouth may get dry. Chewing sugarless gum or sucking hard candy, and drinking plenty of water may help. Contact your doctor if the problem does not go away or is severe. What side effects may I notice from receiving this medicine? Side effects that you should report to  your doctor or health care professional as soon as possible: -allergic reactions like skin rash, itching or hives, swelling of the face, lips, or tongue -breathing problems -changes in vision -confusion -elevated mood, decreased need for sleep, racing thoughts, impulsive behavior -fast or irregular heartbeat -hallucinations, loss of contact with reality -increased blood pressure -redness, blistering, peeling or loosening of the skin, including inside the mouth -seizures -suicidal thoughts or other mood changes -unusually weak or tired -vomiting Side effects that usually do not require medical attention (report to your doctor or health care professional if they continue or are bothersome): -constipation -headache -loss of appetite -nausea -tremors -weight loss This list may not describe all possible side effects. Call your doctor for medical advice about side effects. You may report side effects to FDA at 1-800-FDA-1088. Where should I keep my medicine? Keep out of the reach of children. Store at room temperature between 20 and 25 degrees C (68 and 77 degrees F), away from direct sunlight and moisture. Keep tightly closed. Throw away any unused medicine after the expiration date. NOTE: This sheet is a summary. It may not cover all possible information. If you have questions about this medicine, talk to your doctor, pharmacist, or health care provider.  2018 Elsevier/Gold Standard (2015-09-12 13:52:19)    Living With Depression Everyone experiences occasional disappointment, sadness, and loss in their lives. When you are feeling down, blue, or sad for at least 2 weeks in a row, it may mean that you have depression. Depression can affect your thoughts and feelings, relationships, daily activities, and physical health. It is caused  by changes in the way your brain functions. If you receive a diagnosis of depression, your health care provider will tell you which type of depression you have  and what treatment options are available to you. If you are living with depression, there are ways to help you recover from it and also ways to prevent it from coming back. How to cope with lifestyle changes Coping with stress Stress is your body's reaction to life changes and events, both good and bad. Stressful situations may include:  Getting married.  The death of a spouse.  Losing a job.  Retiring.  Having a baby.  Stress can last just a few hours or it can be ongoing. Stress can play a major role in depression, so it is important to learn both how to cope with stress and how to think about it differently. Talk with your health care provider or a counselor if you would like to learn more about stress reduction. He or she may suggest some stress reduction techniques, such as:  Music therapy. This can include creating music or listening to music. Choose music that you enjoy and that inspires you.  Mindfulness-based meditation. This kind of meditation can be done while sitting or walking. It involves being aware of your normal breaths, rather than trying to control your breathing.  Centering prayer. This is a kind of meditation that involves focusing on a spiritual word or phrase. Choose a word, phrase, or sacred image that is meaningful to you and that brings you peace.  Deep breathing. To do this, expand your stomach and inhale slowly through your nose. Hold your breath for 3-5 seconds, then exhale slowly, allowing your stomach muscles to relax.  Muscle relaxation. This involves intentionally tensing muscles then relaxing them.  Choose a stress reduction technique that fits your lifestyle and personality. Stress reduction techniques take time and practice to develop. Set aside 5-15 minutes a day to do them. Therapists can offer training in these techniques. The training may be covered by some insurance plans. Other things you can do to manage stress include:  Keeping a stress  diary. This can help you learn what triggers your stress and ways to control your response.  Understanding what your limits are and saying no to requests or events that lead to a schedule that is too full.  Thinking about how you respond to certain situations. You may not be able to control everything, but you can control how you react.  Adding humor to your life by watching funny films or TV shows.  Making time for activities that help you relax and not feeling guilty about spending your time this way.  Medicines Your health care provider may suggest certain medicines if he or she feels that they will help improve your condition. Avoid using alcohol and other substances that may prevent your medicines from working properly (may interact). It is also important to:  Talk with your pharmacist or health care provider about all the medicines that you take, their possible side effects, and what medicines are safe to take together.  Make it your goal to take part in all treatment decisions (shared decision-making). This includes giving input on the side effects of medicines. It is best if shared decision-making with your health care provider is part of your total treatment plan.  If your health care provider prescribes a medicine, you may not notice the full benefits of it for 4-8 weeks. Most people who are treated for depression need to  be on medicine for at least 6-12 months after they feel better. If you are taking medicines as part of your treatment, do not stop taking medicines without first talking to your health care provider. You may need to have the medicine slowly decreased (tapered) over time to decrease the risk of harmful side effects. Relationships Your health care provider may suggest family therapy along with individual therapy and drug therapy. While there may not be family problems that are causing you to feel depressed, it is still important to make sure your family learns as much as  they can about your mental health. Having your family's support can help make your treatment successful. How to recognize changes in your condition Everyone has a different response to treatment for depression. Recovery from major depression happens when you have not had signs of major depression for two months. This may mean that you will start to:  Have more interest in doing activities.  Feel less hopeless than you did 2 months ago.  Have more energy.  Overeat less often, or have better or improving appetite.  Have better concentration.  Your health care provider will work with you to decide the next steps in your recovery. It is also important to recognize when your condition is getting worse. Watch for these signs:  Having fatigue or low energy.  Eating too much or too little.  Sleeping too much or too little.  Feeling restless, agitated, or hopeless.  Having trouble concentrating or making decisions.  Having unexplained physical complaints.  Feeling irritable, angry, or aggressive.  Get help as soon as you or your family members notice these symptoms coming back. How to get support and help from others How to talk with friends and family members about your condition Talking to friends and family members about your condition can provide you with one way to get support and guidance. Reach out to trusted friends or family members, explain your symptoms to them, and let them know that you are working with a health care provider to treat your depression. Financial resources Not all insurance plans cover mental health care, so it is important to check with your insurance carrier. If paying for co-pays or counseling services is a problem, search for a local or county mental health care center. They may be able to offer public mental health care services at low or no cost when you are not able to see a private health care provider. If you are taking medicine for depression, you may  be able to get the generic form, which may be less expensive. Some makers of prescription medicines also offer help to patients who cannot afford the medicines they need. Follow these instructions at home:  Get the right amount and quality of sleep.  Cut down on using caffeine, tobacco, alcohol, and other potentially harmful substances.  Try to exercise, such as walking or lifting small weights.  Take over-the-counter and prescription medicines only as told by your health care provider.  Eat a healthy diet that includes plenty of vegetables, fruits, whole grains, low-fat dairy products, and lean protein. Do not eat a lot of foods that are high in solid fats, added sugars, or salt.  Keep all follow-up visits as told by your health care provider. This is important. Contact a health care provider if:  You stop taking your antidepressant medicines, and you have any of these symptoms: ? Nausea. ? Headache. ? Feeling lightheaded. ? Chills and body aches. ? Not being able to  sleep (insomnia).  You or your friends and family think your depression is getting worse. Get help right away if:  You have thoughts of hurting yourself or others. If you ever feel like you may hurt yourself or others, or have thoughts about taking your own life, get help right away. You can go to your nearest emergency department or call:  Your local emergency services (911 in the [place]  A suicide crisis helpline, such as the National Suicide Prevention Lifeline at 229-223-9747. This is open 24-hours a day.  Summary  If you are living with depression, there are ways to help you recover from it and also ways to prevent it from coming back.  Work with your health care team to create a management plan that includes counseling, stress management techniques, and healthy lifestyle habits. This information is not intended to replace advice given to you by your health care provider. Make sure you discuss any questions  you have with your health care provider. Document Released: 02/23/2016 Document Revised: 02/23/2016 Document Reviewed: 02/23/2016 Elsevier Interactive Patient Education  [employer].

## 2018-02-12 ENCOUNTER — Telehealth: Payer: Self-pay | Admitting: Family Medicine

## 2018-02-12 NOTE — Telephone Encounter (Signed)
Schedule patient for US transvaginal complete please schedule or Friday 11/15 or following Wednesday 11/20

## 2018-02-13 NOTE — Telephone Encounter (Signed)
Patient called back & was notified of her [place] appt.

## 2018-02-13 NOTE — Telephone Encounter (Signed)
Ultrasound scheduled for 1 PM on 02/20/18. 12:45 arrival time. Needs to have a full bladder.

## 2018-02-13 NOTE — Telephone Encounter (Signed)
Left VM

## 2018-02-20 ENCOUNTER — Ambulatory Visit (HOSPITAL_COMMUNITY): Payer: BLUE CROSS/BLUE SHIELD

## 2018-02-22 ENCOUNTER — Other Ambulatory Visit: Payer: BLUE CROSS/BLUE SHIELD

## 2018-02-22 ENCOUNTER — Ambulatory Visit (HOSPITAL_COMMUNITY)
Admission: RE | Admit: 2018-02-22 | Discharge: 2018-02-22 | Disposition: A | Discharge status: Home / Self Care | Payer: BLUE CROSS/BLUE SHIELD | Source: Ambulatory Visit | Attending: Family Medicine | Admitting: Family Medicine

## 2018-02-22 DIAGNOSIS — Z131 Encounter for screening for diabetes mellitus: Secondary | ICD-10-CM

## 2018-02-22 DIAGNOSIS — Z130 Encounter for screening for diseases of the blood and blood-forming organs and certain disorders involving the immune mechanism: Secondary | ICD-10-CM

## 2018-02-22 DIAGNOSIS — N946 Dysmenorrhea, unspecified: Secondary | ICD-10-CM | POA: Insufficient documentation

## 2018-02-22 DIAGNOSIS — Z1329 Encounter for screening for other suspected endocrine disorder: Secondary | ICD-10-CM

## 2018-02-22 DIAGNOSIS — Z113 Encounter for screening for infections with a predominantly sexual mode of transmission: Secondary | ICD-10-CM | POA: Diagnosis not present

## 2018-02-22 DIAGNOSIS — Z13220 Encounter for screening for lipoid disorders: Secondary | ICD-10-CM | POA: Diagnosis not present

## 2018-02-23 LAB — COMPREHENSIVE METABOLIC PANEL
A/G RATIO: 1.8 (ref 1.2–2.2)
ALBUMIN: 4.3 g/dL (ref 3.5–5.5)
ALK PHOS: 37 IU/L — AB (ref 39–117)
ALT: 13 IU/L (ref 0–32)
AST: 16 IU/L (ref 0–40)
BILIRUBIN TOTAL: 0.6 mg/dL (ref 0.0–1.2)
BUN / CREAT RATIO: 11 (ref 9–23)
BUN: 7 mg/dL (ref 6–20)
CO2: 22 mmol/L (ref 20–29)
Calcium: 9.1 mg/dL (ref 8.7–10.2)
Chloride: 105 mmol/L (ref 96–106)
Creatinine, Ser: 0.66 mg/dL (ref 0.57–1.00)
GFR calc non Af Amer: 125 mL/min/{1.73_m2} (ref >59)
GFR, EST AFRICAN AMERICAN: 144 mL/min/{1.73_m2} (ref >59)
GLOBULIN, TOTAL: 2.4 g/dL (ref 1.5–4.5)
Glucose: 89 mg/dL (ref 65–99)
Potassium: 4.2 mmol/L (ref 3.5–5.2)
Sodium: 141 mmol/L (ref 134–144)
TOTAL PROTEIN: 6.7 g/dL (ref 6.0–8.5)

## 2018-02-23 LAB — CBC WITH DIFFERENTIAL/PLATELET
Basophils Absolute: 0 10*3/uL (ref 0.0–0.2)
Basos: 1 %
EOS (ABSOLUTE): 0 10*3/uL (ref 0.0–0.4)
EOS: 1 %
HEMOGLOBIN: 13.4 g/dL (ref 11.1–15.9)
Hematocrit: 39 % (ref 34.0–46.6)
Immature Grans (Abs): 0 10*3/uL (ref 0.0–0.1)
Immature Granulocytes: 0 %
LYMPHS ABS: 2.2 10*3/uL (ref 0.7–3.1)
Lymphs: 62 %
MCH: 28.3 pg (ref 26.6–33.0)
MCHC: 34.4 g/dL (ref 31.5–35.7)
MCV: 83 fL (ref 79–97)
MONOCYTES: 6 %
MONOS ABS: 0.2 10*3/uL (ref 0.1–0.9)
NEUTROS ABS: 1 10*3/uL — AB (ref 1.4–7.0)
Neutrophils: 30 %
Platelets: 238 10*3/uL (ref 150–450)
RBC: 4.73 x10E6/uL (ref 3.77–5.28)
RDW: 13.7 % (ref 12.3–15.4)
WBC: 3.5 10*3/uL (ref 3.4–10.8)

## 2018-02-23 LAB — HEMOGLOBIN A1C
Est. average glucose Bld gHb Est-mCnc: 111 mg/dL
HEMOGLOBIN A1C: 5.5 % (ref 4.8–5.6)

## 2018-02-23 LAB — LIPID PANEL
Chol/HDL Ratio: 3.1 ratio (ref 0.0–4.4)
Cholesterol, Total: 139 mg/dL (ref 100–199)
HDL: 45 mg/dL (ref >39)
LDL CALC: 86 mg/dL (ref 0–99)
Triglycerides: 42 mg/dL (ref 0–149)
VLDL CHOLESTEROL CAL: 8 mg/dL (ref 5–40)

## 2018-02-23 LAB — TSH: TSH: 0.916 u[IU]/mL (ref 0.450–4.500)

## 2018-02-23 LAB — RPR: RPR Ser Ql: NONREACTIVE

## 2018-02-23 LAB — HIV ANTIBODY (ROUTINE TESTING W REFLEX): HIV Screen 4th Generation wRfx: NONREACTIVE

## 2018-02-24 ENCOUNTER — Encounter: Payer: Self-pay | Admitting: Family Medicine

## 2018-02-24 LAB — GC/CHLAMYDIA PROBE AMP
CHLAMYDIA, DNA PROBE: NEGATIVE
NEISSERIA GONORRHOEAE BY PCR: NEGATIVE

## 2018-03-23 ENCOUNTER — Ambulatory Visit: Payer: BLUE CROSS/BLUE SHIELD | Admitting: Family Medicine

## 2018-03-23 NOTE — Addendum Note (Signed)
Addended by: Bing NeighborsHARRIS, Ashleynicole Mcclees S on: 03/23/2018 01:33 PM   Modules accepted: Orders

## 2018-04-11 ENCOUNTER — Encounter: Payer: Self-pay | Admitting: Family Medicine

## 2018-04-11 ENCOUNTER — Ambulatory Visit (INDEPENDENT_AMBULATORY_CARE_PROVIDER_SITE_OTHER): Payer: BLUE CROSS/BLUE SHIELD | Admitting: Family Medicine

## 2018-04-11 VITALS — BP 113/72 | HR 81 | Temp 97.7°F | Resp 17 | Ht 66.0 in | Wt 267.2 lb

## 2018-04-11 DIAGNOSIS — J40 Bronchitis, not specified as acute or chronic: Secondary | ICD-10-CM | POA: Diagnosis not present

## 2018-04-11 DIAGNOSIS — J029 Acute pharyngitis, unspecified: Secondary | ICD-10-CM

## 2018-04-11 DIAGNOSIS — R062 Wheezing: Secondary | ICD-10-CM | POA: Diagnosis not present

## 2018-04-11 DIAGNOSIS — R05 Cough: Secondary | ICD-10-CM | POA: Diagnosis not present

## 2018-04-11 DIAGNOSIS — R6889 Other general symptoms and signs: Secondary | ICD-10-CM

## 2018-04-11 DIAGNOSIS — Z20828 Contact with and (suspected) exposure to other viral communicable diseases: Secondary | ICD-10-CM

## 2018-04-11 DIAGNOSIS — R0981 Nasal congestion: Secondary | ICD-10-CM

## 2018-04-11 MED ORDER — LEVOCETIRIZINE DIHYDROCHLORIDE 5 MG PO TABS: 5.0000 mg | ORAL_TABLET | Freq: Every evening | ORAL | 1 refills | Status: DC

## 2018-04-11 MED ORDER — BENZONATATE 100 MG PO CAPS: 100.0000 mg | ORAL_CAPSULE | Freq: Three times a day (TID) | ORAL | 0 refills | Status: DC | PRN

## 2018-04-11 MED ORDER — IPRATROPIUM BROMIDE 0.03 % NA SOLN: 2.0000 | Freq: Two times a day (BID) | NASAL | 0 refills | Status: DC

## 2018-04-11 MED ORDER — OSELTAMIVIR PHOSPHATE 75 MG PO CAPS: 75.0000 mg | ORAL_CAPSULE | Freq: Two times a day (BID) | ORAL | 0 refills | Status: DC

## 2018-04-11 NOTE — Patient Instructions (Signed)

## 2018-04-11 NOTE — Progress Notes (Signed)
Subjective:   Chief Complaint  Patient presents with  . URI    dry cough, nasal congestion, wheezing x 4 days. no fever, chills, ear pain, sore throat. has been taking Nyquil with some relief. BF had the flu recently      Meshia Crapps is a 23 y.o. female who presents for evaluation of symptoms of a bronchitis and recent influenza exposure. Recent flu exposure from boyfriend. Symptoms include shortness of breath, wheezing, dry cough, and nasal congestion. She has taken Dayquil without improvement of symptoms. Symptoms began 3 days prior. She has remained afebrile. No known history of asthma. She is a non-smoker.  Past Medical History:  Diagnosis Date  . Genital herpes   . History of chlamydia   . History of gonorrhea     Review of Systems Pertinent items noted in HPI  Objective:  General appearance: alert, well developed, well nourished, non-ill appearing, negative for distress Head: Normocephalic, without obvious abnormality, atraumatic, oropharynx clear, bilateral nares edematous with dried nasal discharge Respiratory: Respirations even and unlabored, normal respiratory rate, no wheezing, or adventitious sounds noted on exam Heart: RRR, +S1/S2, no gallops or murmurs noted with auscultation  Extremities: No gross deformities Skin: Skin color, texture, turgor normal. No rashes seen  Psych: Appropriate mood and affect. Neurologic: Mental status: Alert, oriented to person, place, and time, thought content appropriate. Assessment and Plan:  1. Bronchitis, treating symptomatically for cough and nasal congestion as patient is no longer experiencing wheezing or shortness of breath. 2. Exposure to the flu 3. Flu-like symptoms -Given recent exposure and current symptoms, will opt for treatment dose of Tamiflu. Patient advised to follow-up if symptoms worsen or do not improve.   Meds ordered this encounter  Medications  . levocetirizine (XYZAL) 5 MG tablet    Sig: Take 1 tablet (5 mg  total) by mouth every evening.    Dispense:  30 tablet    Refill:  1  . ipratropium (ATROVENT) 0.03 % nasal spray    Sig: Place 2 sprays into both nostrils 2 (two) times daily.    Dispense:  30 mL    Refill:  0  . benzonatate (TESSALON) 100 MG capsule    Sig: Take 1-2 capsules (100-200 mg total) by mouth 3 (three) times daily as needed for cough.    Dispense:  40 capsule    Refill:  0  . oseltamivir (TAMIFLU) 75 MG capsule    Sig: Take 1 capsule (75 mg total) by mouth 2 (two) times daily.    Dispense:  10 capsule    Refill:  0     Joaquin Courts, FNP Primary Care at [facility] [address] [place] [address] [phone]: [phone]

## 2018-04-13 ENCOUNTER — Encounter: Payer: Self-pay | Admitting: Family Medicine

## 2018-04-20 ENCOUNTER — Other Ambulatory Visit: Payer: Self-pay | Admitting: Family Medicine

## 2018-06-01 ENCOUNTER — Encounter: Payer: BLUE CROSS/BLUE SHIELD | Admitting: Obstetrics & Gynecology

## 2018-06-01 NOTE — Progress Notes (Deleted)
   Patient did not show up today for her scheduled appointment.   Joyia Riehle, MD, FACOG Obstetrician & Gynecologist, [facility], [facility]  

## 2018-06-06 ENCOUNTER — Encounter (HOSPITAL_COMMUNITY): Payer: Self-pay

## 2018-06-06 ENCOUNTER — Ambulatory Visit (HOSPITAL_COMMUNITY)
Admission: EM | Admit: 2018-06-06 | Discharge: 2018-06-06 | Disposition: A | Discharge status: Home / Self Care | Payer: Self-pay | Attending: Family Medicine | Admitting: Family Medicine

## 2018-06-06 DIAGNOSIS — Z113 Encounter for screening for infections with a predominantly sexual mode of transmission: Secondary | ICD-10-CM

## 2018-06-06 DIAGNOSIS — Z3202 Encounter for pregnancy test, result negative: Secondary | ICD-10-CM

## 2018-06-06 DIAGNOSIS — Z202 Contact with and (suspected) exposure to infections with a predominantly sexual mode of transmission: Secondary | ICD-10-CM | POA: Insufficient documentation

## 2018-06-06 LAB — POCT PREGNANCY, URINE: Preg Test, Ur: NEGATIVE

## 2018-06-06 MED ORDER — FLUCONAZOLE 150 MG PO TABS: 150.0000 mg | ORAL_TABLET | Freq: Every day | ORAL | 0 refills | Status: DC

## 2018-06-06 MED ORDER — METRONIDAZOLE 500 MG PO TABS: 500.0000 mg | ORAL_TABLET | Freq: Two times a day (BID) | ORAL | 0 refills | Status: DC
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
## 2018-06-06 NOTE — Discharge Instructions (Signed)
Metronidazole 2 times a day for 7 days Do not drink alcohol while on metronidazole Take Diflucan 1 today and then 1 in 1 week Avoid sexual relations for 7 days We did lab testing during this visit.  If there are any abnormal findings that require change in medicine or indicate a positive result, you will be notified.  If all of your tests are normal, you will not be called.

## 2018-06-08 LAB — CERVICOVAGINAL ANCILLARY ONLY
Chlamydia: NEGATIVE
NEISSERIA GONORRHEA: NEGATIVE
Trichomonas: NEGATIVE

## 2018-06-12 ENCOUNTER — Encounter: Payer: Self-pay | Admitting: Family Medicine

## 2018-06-13 ENCOUNTER — Other Ambulatory Visit: Payer: Self-pay | Admitting: Family Medicine

## 2018-06-13 MED ORDER — BUPROPION HCL ER (XL) 300 MG PO TB24: 300.0000 mg | ORAL_TABLET | Freq: Every day | ORAL | 2 refills | Status: DC

## 2018-06-13 NOTE — Progress Notes (Signed)
Increased Wellbutrin per request via My chart message to 300 mg XL. Sent [facility] [address]

## 2018-07-04 ENCOUNTER — Encounter: Payer: Self-pay | Admitting: Family Medicine

## 2018-07-11 ENCOUNTER — Other Ambulatory Visit: Payer: Self-pay | Admitting: Family Medicine

## 2018-07-12 ENCOUNTER — Other Ambulatory Visit: Payer: Self-pay | Admitting: Family Medicine

## 2018-07-12 MED ORDER — BUPROPION HCL ER (XL) 300 MG PO TB24: 300.0000 mg | ORAL_TABLET | Freq: Every day | ORAL | 3 refills | Status: DC

## 2018-07-12 NOTE — Progress Notes (Signed)
Medication refilled per electronic request.
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
## 2018-09-13 ENCOUNTER — Other Ambulatory Visit: Payer: Self-pay | Admitting: Family Medicine

## 2018-10-05 ENCOUNTER — Encounter: Payer: Self-pay | Admitting: Family Medicine

## 2018-10-25 ENCOUNTER — Encounter: Payer: Self-pay | Admitting: Pharmacist

## 2018-12-30 ENCOUNTER — Encounter: Payer: Self-pay | Admitting: Family Medicine

## 2019-01-31 ENCOUNTER — Other Ambulatory Visit: Payer: Self-pay | Admitting: Family Medicine

## 2019-02-01 MED ORDER — BUPROPION HCL ER (XL) 300 MG PO TB24: 300.0000 mg | ORAL_TABLET | Freq: Every day | ORAL | 0 refills | Status: AC

## 2019-02-01 NOTE — Addendum Note (Signed)
Addended by: Carylon Perches on: 02/01/2019 05:16 PM   Modules accepted: Orders

## 2019-02-01 NOTE — Telephone Encounter (Signed)
Patient called in. Informed that refill request was denied b/c she hasn't been seen since January 2020. She states that she lost insurance but will have insurance again next months since she started a new job. Informed her that a short Rx would be prescribed but she would need an appointment prior to receiving refills.

## 2019-04-30 DIAGNOSIS — Z1159 Encounter for screening for other viral diseases: Secondary | ICD-10-CM | POA: Diagnosis not present

## 2019-04-30 DIAGNOSIS — N911 Secondary amenorrhea: Secondary | ICD-10-CM | POA: Diagnosis not present

## 2019-04-30 DIAGNOSIS — Z7251 High risk heterosexual behavior: Secondary | ICD-10-CM | POA: Diagnosis not present

## 2019-10-04 ENCOUNTER — Telehealth: Payer: Self-pay | Admitting: Internal Medicine

## 2020-01-22 ENCOUNTER — Other Ambulatory Visit: Payer: Self-pay

## 2020-01-22 ENCOUNTER — Ambulatory Visit
Admission: EM | Admit: 2020-01-22 | Discharge: 2020-01-22 | Disposition: A | Discharge status: Home / Self Care | Payer: BC Managed Care – PPO | Attending: Emergency Medicine | Admitting: Emergency Medicine

## 2020-01-22 DIAGNOSIS — J069 Acute upper respiratory infection, unspecified: Secondary | ICD-10-CM | POA: Diagnosis not present

## 2020-01-22 DIAGNOSIS — Z20822 Contact with and (suspected) exposure to covid-19: Secondary | ICD-10-CM

## 2020-01-22 LAB — SARS CORONAVIRUS 2 (TAT 6-24 HRS): SARS Coronavirus 2: NEGATIVE

## 2020-01-22 MED ORDER — FLUTICASONE PROPIONATE 50 MCG/ACT NA SUSP: 2.0000 | Freq: Every day | NASAL | 0 refills | Status: AC

## 2020-01-22 MED ORDER — IBUPROFEN 600 MG PO TABS: 600.0000 mg | ORAL_TABLET | Freq: Four times a day (QID) | ORAL | 0 refills | Status: DC | PRN

## 2020-01-22 NOTE — Discharge Instructions (Addendum)
Take 1000 mg Tylenol combined with 600 mg of ibuprofen 3 or 4 times a day.   Mucinex D, Flonase, saline nasal irrigation NeilMed rinse as often as you want to prevent bacterial sinus infection.Marland Kitchen

## 2020-01-22 NOTE — ED Provider Notes (Signed)
HPI  SUBJECTIVE:  Kelly Mcdaniel is a 24 y.o. female who presents with 2 days of sore throat described as dry, itchy, irritated, nasal congestion.  headache starting yesterday.  She denies fevers, body aches, rhinorrhea, postnasal drip, loss of sense of smell.  She reports altered taste.  No cough, shortness of breath, nausea, vomiting, diarrhea, abdominal pain.  She got the second dose of the moderna vaccine over 2 weeks ago.  She reports sneezing, but no itchy, watery eyes.  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  No antipyretic in the past 6 hours.  She tried Tylenol vitamin C with improvement in her symptoms.  No aggravating factors.  Past medical history negative for pulmonary disease, diabetes, hypertension, coronary disease, chronic kidney disease, HIV, cancer, immunocompromise, allergies.  LMP: 9/31.  Denies the possibility of being pregnant.  DXI:PJASNK, Patsy Lager, FNP     Past Medical History:  Diagnosis Date  . Genital herpes   . History of chlamydia   . History of gonorrhea     History reviewed. No pertinent surgical history.  Family History  Problem Relation Age of Onset  . Diabetes Father   . Hyperlipidemia Father   . Healthy Sister   . Healthy Brother   . Healthy Sister   . Neurologic Disorder Paternal Grandfather     Social History   Tobacco Use  . Smoking status: Never Smoker  . Smokeless tobacco: Never Used  Vaping Use  . Vaping Use: Never used  Substance Use Topics  . Alcohol use: Yes  . Drug use: No    No current facility-administered medications for this encounter.  Current Outpatient Medications:  .  buPROPion (WELLBUTRIN XL) 300 MG 24 hr tablet, Take 1 tablet (300 mg total) by mouth daily., Disp: 30 tablet, Rfl: 0 .  fluticasone (FLONASE) 50 MCG/ACT nasal spray, Place 2 sprays into both nostrils daily., Disp: 16 g, Rfl: 0 .  ibuprofen (ADVIL) 600 MG tablet, Take 1 tablet (600 mg total) by mouth every 6  (six) hours as needed., Disp: 30 tablet, Rfl: 0  No Known Allergies   ROS  As noted in HPI.   Physical Exam  BP (!) 104/56 (BP Location: Right Arm)   Pulse 81   Temp 98.1 F (36.7 C) (Oral)   Resp 16   Ht 5\' 4"  (1.626 m)   Wt 115.7 kg   LMP 01/03/2020   SpO2 100%   BMI 43.77 kg/m   Constitutional: Well developed, well nourished, no acute distress Eyes:  EOMI, conjunctiva normal bilaterally HENT: Normocephalic, atraumatic,mucus membranes moist, no nasal congestion.  Normal oropharynx, no tonsillar exudates.  No postnasal drip or cobblestoning Neck: No cervical lymphadenopathy Respiratory: Normal inspiratory effort lungs clear bilaterally Cardiovascular: Normal rate regular rhythm no murmurs rubs or gallops GI: nondistended skin: No rash, skin intact Musculoskeletal: no deformities Neurologic: Alert & oriented x 3, no focal neuro deficits Psychiatric: Speech and behavior appropriate   ED Course   Medications - No data to display  Orders Placed This Encounter  Procedures  . SARS CORONAVIRUS 2 (TAT 6-24 HRS) Nasopharyngeal Nasopharyngeal Swab    Standing Status:   Standing    Number of Occurrences:   1    Order Specific Question:   Is this test for diagnosis or screening    Answer:   Diagnosis of ill patient    Order Specific Question:   Symptomatic for COVID-19 as defined by [facility]  Answer:   Yes    Order Specific Question:   Date of Symptom Onset    Answer:   01/21/2020    Order Specific Question:   Hospitalized for COVID-19    Answer:   No    Order Specific Question:   Admitted to ICU for COVID-19    Answer:   No    Order Specific Question:   Previously tested for COVID-19    Answer:   No    Order Specific Question:   Resident in a congregate (group) care setting    Answer:   No    Order Specific Question:   Employed in healthcare setting    Answer:   No    Order Specific Question:   Pregnant    Answer:   No    Order Specific Question:   Has patient  completed COVID vaccination(s) (2 doses of Pfizer/Moderna 1 dose of [employer])    Answer:   No    No results found for this or any previous visit (from the past 24 hour(s)). No results found.  ED Clinical Impression  1. Viral URI   2. Encounter for laboratory testing for COVID-19 virus      ED Assessment/Plan  Patient will be a candidate for monoclonal antibody infusion based on BMI.  In the meantime, supportive treatment.  Tylenol/ibuprofen, Mucinex D, Flonase, saline nasal irrigation.   Discussed labs,  MDM, treatment plan, and plan for follow-up with patient. . patient agrees with plan.   Meds ordered this encounter  Medications  . fluticasone (FLONASE) 50 MCG/ACT nasal spray    Sig: Place 2 sprays into both nostrils daily.    Dispense:  16 g    Refill:  0  . ibuprofen (ADVIL) 600 MG tablet    Sig: Take 1 tablet (600 mg total) by mouth every 6 (six) hours as needed.    Dispense:  30 tablet    Refill:  0    *This clinic note was created using Scientist, clinical (histocompatibility and immunogenetics). Therefore, there may be occasional mistakes despite careful proofreading.   ?    Domenick Gong, MD 01/22/20 [phone]

## 2020-01-22 NOTE — ED Triage Notes (Signed)
Patient states that she has been exposed to covid. 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 States that she has had some nasal congestion, runny nose and dry throat. Denies any burning or painful swallowing.

## 2020-03-10 IMAGING — US US PELVIS COMPLETE TRANSABD/TRANSVAG
1 series · 15 of 25 positions shown · non-contrast
Comparison: None

CLINICAL DATA: Pelvic pain and cramping for over 1 year.
Dysmenorrhea. LMP 02/13/2018.

EXAM:
TRANSABDOMINAL AND TRANSVAGINAL ULTRASOUND OF PELVIS
TECHNIQUE: Both transabdominal and transvaginal ultrasound examinations of the
pelvis were performed. Transabdominal technique was performed for
global imaging of the pelvis including uterus, ovaries, adnexal
regions, and pelvic cul-de-sac. It was necessary to proceed with
endovaginal exam following the transabdominal exam to visualize the
endometrium and ovaries.

[Series 1: us pelvis complete transabd/transvag · 15 of 49 slices shown]
[im 1/49]
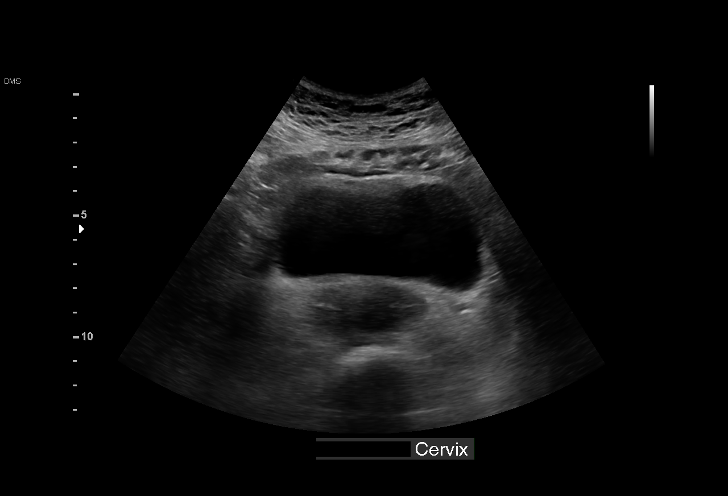
[im 5/49]
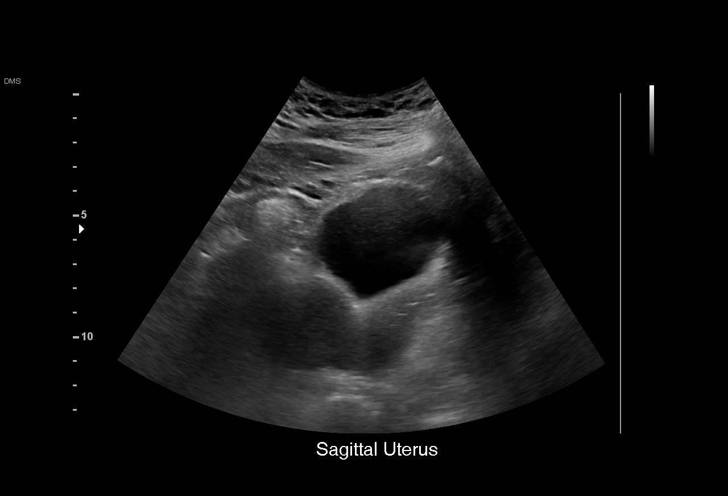
[im 9/49]
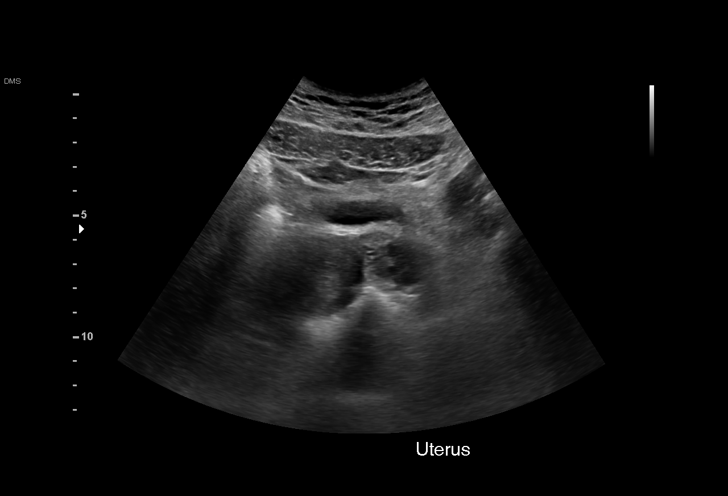
[im 11/49]
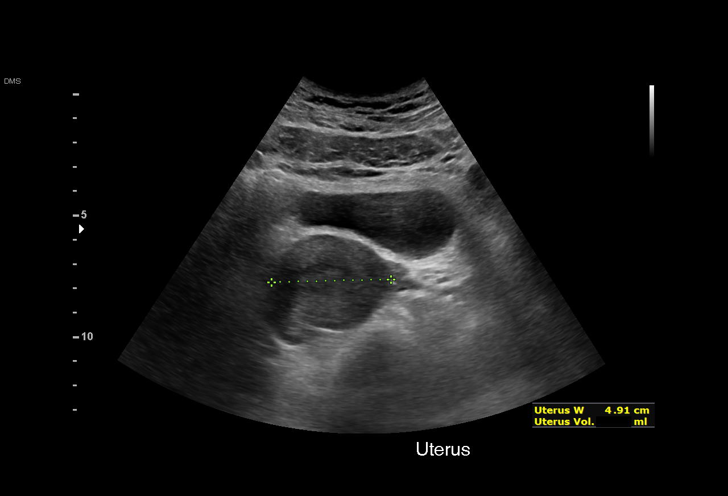
[im 15/49]
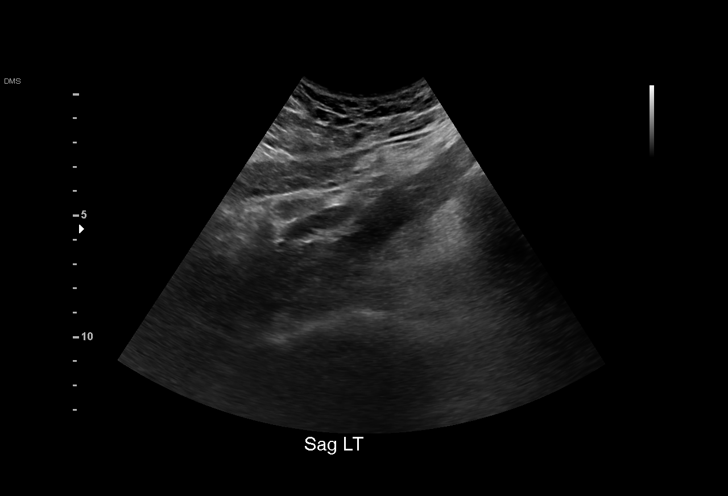
[im 19/49]
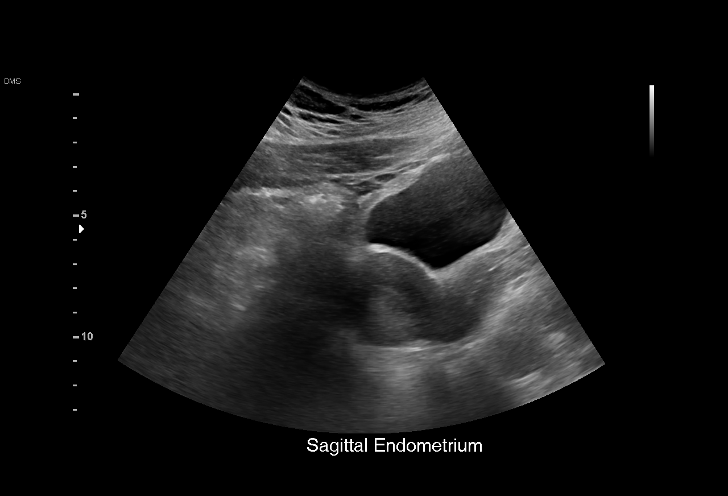
[im 21/49]
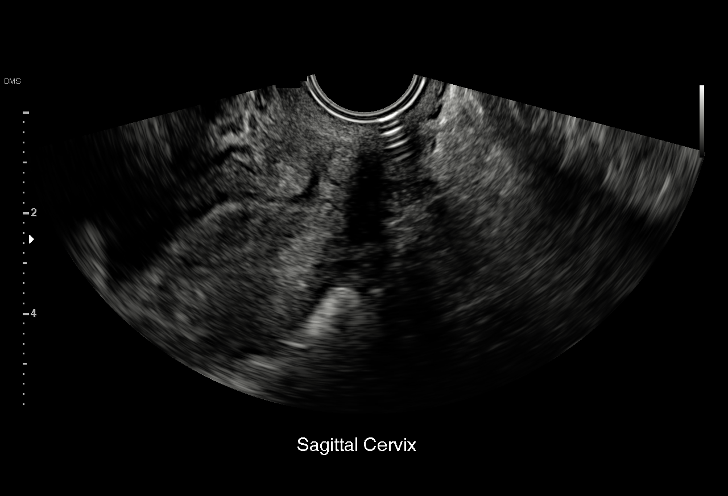
[im 25/49]
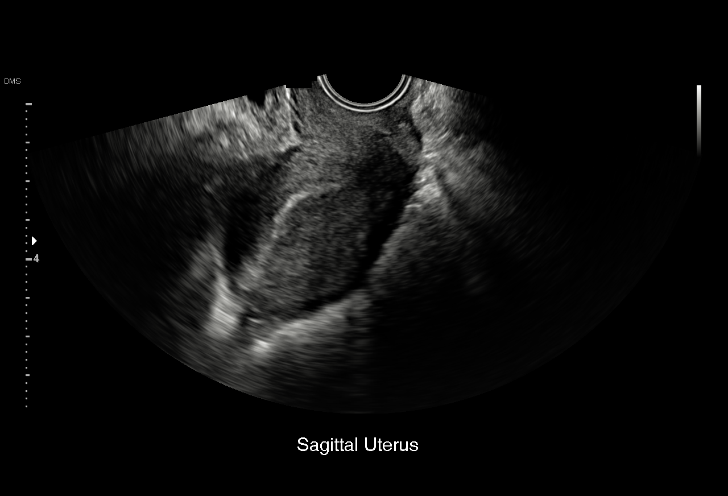
[im 29/49]
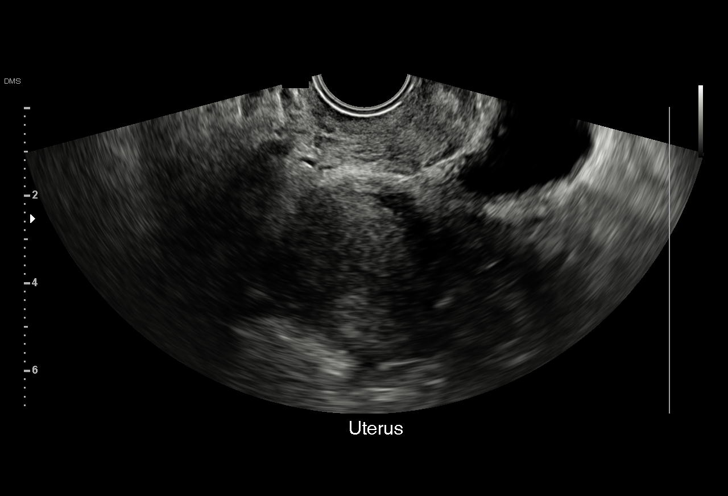
[im 31/49]
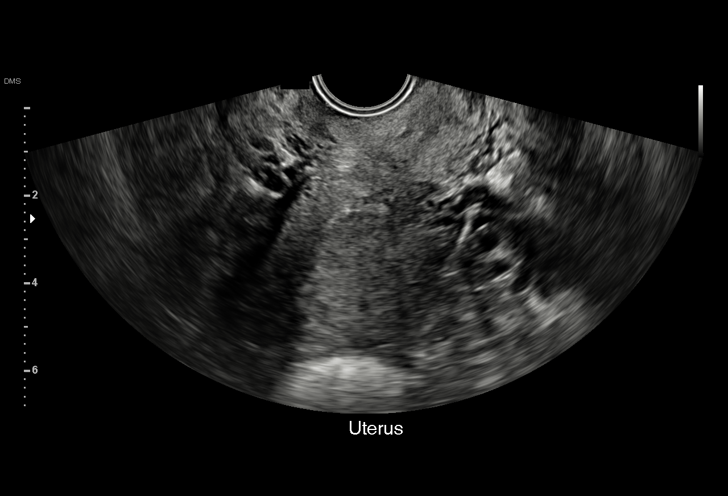
[im 35/49]
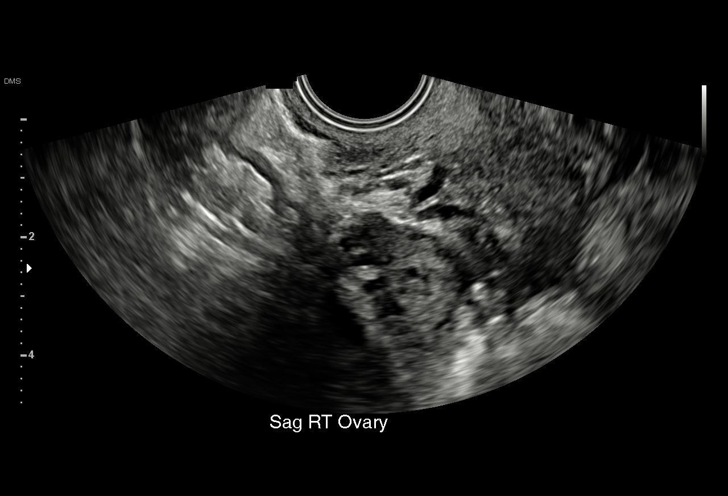
[im 39/49]
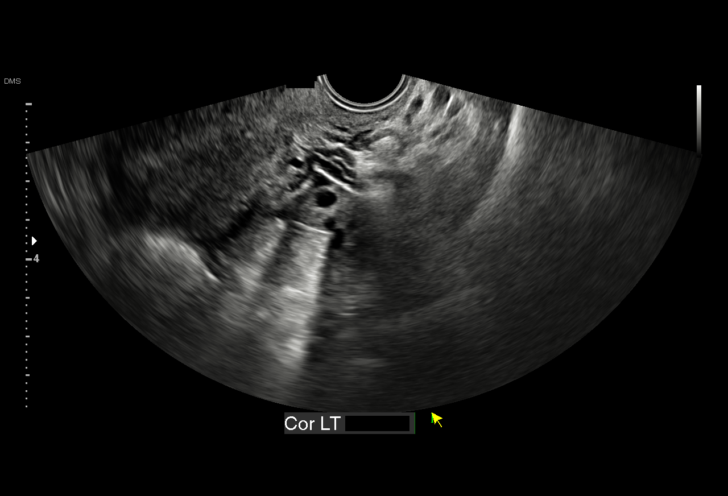
[im 41/49]
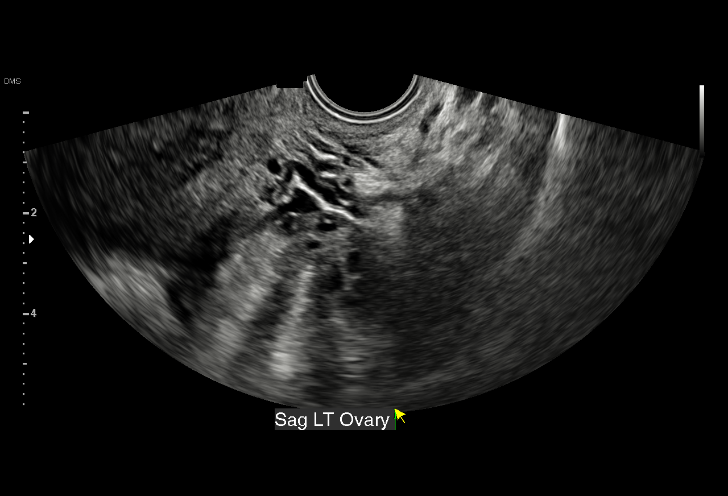
[im 45/49]
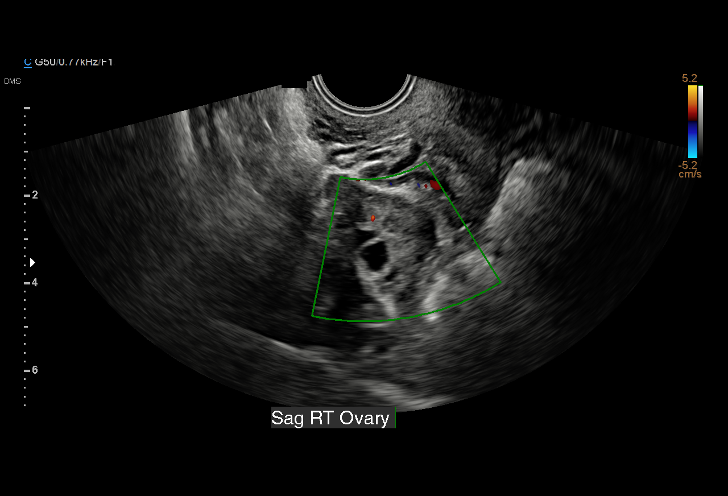
[im 49/49]
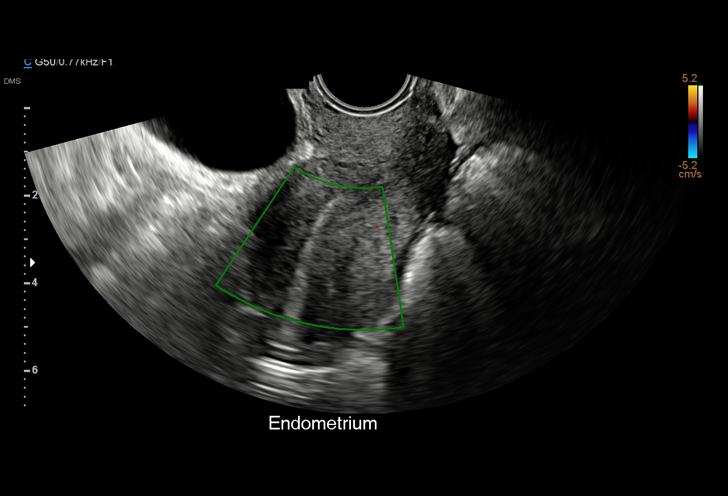

[15 of 25 positions shown; findings below may reference images not displayed]

FINDINGS: Uterus

Measurements: 6.0 x 3.5 x 4.9 cm = volume: 54 mL. No fibroids or
other mass visualized.

Endometrium

Thickness: 4 mm.  No focal abnormality visualized.

Right ovary

Measurements: 2.8 x 1.8 x 2.5 cm = volume: 6.6 mL. Normal
appearance/no adnexal mass.

Left ovary

Measurements: 2.5 x 1.7 x 1.8 cm = volume: 4.0 mL. Normal
appearance/no adnexal mass.

Other findings

No abnormal free fluid.
IMPRESSION: Negative. No pelvic mass or other significant abnormality
identified.

## 2020-04-25 ENCOUNTER — Other Ambulatory Visit: Payer: Self-pay

## 2020-04-25 ENCOUNTER — Ambulatory Visit
Admission: EM | Admit: 2020-04-25 | Discharge: 2020-04-25 | Disposition: A | Discharge status: Home / Self Care | Payer: BC Managed Care – PPO | Attending: Emergency Medicine | Admitting: Emergency Medicine

## 2020-04-25 DIAGNOSIS — J020 Streptococcal pharyngitis: Secondary | ICD-10-CM | POA: Diagnosis present

## 2020-04-25 DIAGNOSIS — N898 Other specified noninflammatory disorders of vagina: Secondary | ICD-10-CM | POA: Insufficient documentation

## 2020-04-25 HISTORY — DX: Premenstrual dysphoric disorder: F32.81

## 2020-04-25 HISTORY — DX: Obesity, unspecified: E66.9

## 2020-04-25 LAB — POCT URINALYSIS DIP (MANUAL ENTRY)
Bilirubin, UA: NEGATIVE
Glucose, UA: NEGATIVE mg/dL
Ketones, POC UA: NEGATIVE mg/dL
Nitrite, UA: NEGATIVE
Protein Ur, POC: NEGATIVE mg/dL
Spec Grav, UA: 1.015 (ref 1.010–1.025)
Urobilinogen, UA: 0.2 E.U./dL
pH, UA: 7.5 (ref 5.0–8.0)

## 2020-04-25 LAB — POCT RAPID STREP A (OFFICE): Rapid Strep A Screen: NEGATIVE

## 2020-04-25 MED ORDER — CEPHALEXIN 500 MG PO CAPS: 500.0000 mg | ORAL_CAPSULE | Freq: Four times a day (QID) | ORAL | 0 refills | Status: AC

## 2020-04-25 MED ORDER — FLUCONAZOLE 150 MG PO TABS: 150.0000 mg | ORAL_TABLET | Freq: Every day | ORAL | 1 refills | Status: AC

## 2020-04-25 NOTE — Discharge Instructions (Signed)
You have test pending.  Take antibiotic as directed.

## 2020-04-25 NOTE — ED Triage Notes (Addendum)
Pt is here with vaginal discharge,itching that started Friday, pt states it could be a Herpes outbreak and states she is not taking Valtrex. Pt also compliants of a sore throat that started Saturday, pt has not taken any meds to relieve discomfort.

## 2020-04-25 NOTE — ED Provider Notes (Signed)
[facility]    CSN: [id number] Arrival date & time: 04/25/20  0950      History   Chief Complaint Chief Complaint  Patient presents with   Sore Throat   Vaginal Discharge    HPI Kelly Mcdaniel is a 25 y.o. female.   The history is provided by the patient. No language interpreter was used.  Sore Throat This is a new problem. The current episode started 2 days ago. The problem occurs constantly. The problem has not changed since onset.Nothing aggravates the symptoms. Nothing relieves the symptoms. She has tried nothing for the symptoms.  Vaginal Discharge Pt complains of discomfort with urination and some discahrge that looks like yeast.   Past Medical History:  Diagnosis Date   Genital herpes    History of chlamydia    History of gonorrhea    Obesity    PMDD (premenstrual dysphoric disorder)     There are no problems to display for this patient.   History reviewed. No pertinent surgical history.  OB History   No obstetric history on file.      Home Medications    Prior to Admission medications   Medication Sig Start Date End Date Taking? Authorizing Provider  Calcium Carbonate-Vitamin D (OYSTER SHELL CALCIUM/D) 250-125 MG-UNIT TABS Take 1 tablet by mouth daily.   Yes [provider]  cephALEXin (KEFLEX) 500 MG capsule Take 1 capsule (500 mg total) by mouth 4 (four) times daily for 7 days. 04/25/20 05/02/20 Yes Elson Areas, PA-C  fluconazole (DIFLUCAN) 150 MG tablet Take 1 tablet (150 mg total) by mouth daily for 1 day. 04/25/20 04/26/20 Yes Elson Areas, PA-C  valACYclovir (VALTREX) 500 MG tablet Take by mouth. 10/18/19  Yes [provider]  buPROPion (WELLBUTRIN XL) 300 MG 24 hr tablet Take 1 tablet (300 mg total) by mouth daily. 02/01/19   Hoy Register, MD  fluticasone (FLONASE) 50 MCG/ACT nasal spray Place 2 sprays into both nostrils daily. 01/22/20   Domenick Gong, MD  ibuprofen (ADVIL) 600 MG tablet Take 1  tablet (600 mg total) by mouth every 6 (six) hours as needed. 01/22/20   Domenick Gong, MD  metroNIDAZOLE (FLAGYL) 500 MG tablet Take 500 mg by mouth 2 (two) times daily. 11/14/19   [provider]  ipratropium (ATROVENT) 0.03 % nasal spray Place 2 sprays into both nostrils 2 (two) times daily. 04/11/18 01/22/20  Bing Neighbors, FNP    Family History Family History  Problem Relation Age of Onset   Diabetes Father    Hyperlipidemia Father    Hypertension Father    Edema Mother    Healthy Sister    Healthy Brother    Healthy Sister    Neurologic Disorder Paternal Grandfather     Social History Social History   Tobacco Use   Smoking status: Never Smoker   Smokeless tobacco: Never Used  Building services engineer Use: Never used  Substance Use Topics   Alcohol use: Yes   Drug use: Yes    Types: Marijuana     Allergies   Patient has no known allergies.   Review of Systems Review of Systems  Genitourinary: Positive for vaginal discharge.  All other systems reviewed and are negative.    Physical Exam Triage Vital Signs ED Triage Vitals  Enc Vitals Group     BP 04/25/20 1042 101/71     Pulse Rate 04/25/20 1042 87     Resp 04/25/20 1042 19  Temp 04/25/20 1042 98.4 F (36.9 C)     Temp Source 04/25/20 1042 Oral     SpO2 04/25/20 1042 99 %     Weight --      Height --      Head Circumference --      Peak Flow --      Pain Score 04/25/20 1025 0     Pain Loc --      Pain Edu? --      Excl. in GC? --    No data found.  Updated Vital Signs BP 101/71 (BP Location: Left Arm)    Pulse 87    Temp 98.4 F (36.9 C) (Oral)    Resp 19    LMP 03/29/2020    SpO2 99%   Visual Acuity Right Eye Distance:   Left Eye Distance:   Bilateral Distance:    Right Eye Near:   Left Eye Near:    Bilateral Near:     Physical Exam Vitals and nursing note reviewed.  Constitutional:      Appearance: She is well-developed and well-nourished.  HENT:      Head: Normocephalic.     Mouth/Throat:     Mouth: Mucous membranes are moist.  Eyes:     Extraocular Movements: EOM normal.  Cardiovascular:     Rate and Rhythm: Normal rate.  Pulmonary:     Effort: Pulmonary effort is normal.  Abdominal:     General: There is no distension.     Palpations: Abdomen is soft.  Musculoskeletal:        General: Normal range of motion.     Cervical back: Normal range of motion.  Neurological:     General: No focal deficit present.     Mental Status: She is alert and oriented to person, place, and time.  Psychiatric:        Mood and Affect: Mood and affect normal.      UC Treatments / Results  Labs (all labs ordered are listed, but only abnormal results are displayed) Labs Reviewed  POCT URINALYSIS DIP (MANUAL ENTRY) - Abnormal; Notable for the following components:      Result Value   Blood, UA trace-lysed (*)    Leukocytes, UA Large (3+) (*)    All other components within normal limits  NOVEL CORONAVIRUS, NAA  CULTURE, GROUP A STREP ([facility])  URINALYSIS, ROUTINE W REFLEX MICROSCOPIC  POCT RAPID STREP A (OFFICE)    EKG   Radiology No results found.  Procedures Procedures (including critical care time)  Medications Ordered in UC Medications - No data to display  Initial Impression / Assessment and Plan / UC Course  I have reviewed the triage vital signs and the nursing notes.  Pertinent labs & imaging results that were available during my care of the patient were reviewed by me and considered in my medical decision making (see chart for details).     MDM:  Ua shows leukocytes, strep is negative  Gc/ct and covid pending.   Final Clinical Impressions(s) / UC Diagnoses   Final diagnoses:  Streptococcal sore throat  Vaginal discharge     Discharge Instructions     You have test pending.  Take antibiotic as directed.     ED Prescriptions    Medication Sig Dispense Auth. Provider   fluconazole (DIFLUCAN) 150 MG tablet Take  1 tablet (150 mg total) by mouth daily for 1 day. 1 tablet Victormanuel Mclure K, PA-C   cephALEXin (KEFLEX) 500 MG  capsule Take 1 capsule (500 mg total) by mouth 4 (four) times daily for 7 days. 28 capsule Elson Areas, [place]     PDMP not reviewed this encounter.  An After Visit Summary was printed and given to the patient.    Elson Areas, [place] 04/25/20 1114

## 2020-04-27 LAB — SARS-COV-2, NAA 2 DAY TAT

## 2020-04-27 LAB — URINE CULTURE: Culture: NO GROWTH

## 2020-04-27 LAB — NOVEL CORONAVIRUS, NAA: SARS-CoV-2, NAA: NOT DETECTED

## 2020-04-28 LAB — CERVICOVAGINAL ANCILLARY ONLY
Bacterial Vaginitis (gardnerella): NEGATIVE
Candida Glabrata: NEGATIVE
Candida Vaginitis: POSITIVE — AB
Chlamydia: NEGATIVE
Comment: NEGATIVE
Comment: NEGATIVE
Comment: NEGATIVE
Comment: NEGATIVE
Comment: NEGATIVE
Comment: NORMAL
Neisseria Gonorrhea: NEGATIVE
Trichomonas: NEGATIVE

## 2020-04-28 LAB — CULTURE, GROUP A STREP (THRC): Special Requests: NORMAL

## 2020-06-24 ENCOUNTER — Ambulatory Visit
Admission: EM | Admit: 2020-06-24 | Discharge: 2020-06-24 | Disposition: A | Discharge status: Home / Self Care | Payer: BC Managed Care – PPO | Attending: Emergency Medicine | Admitting: Emergency Medicine

## 2020-06-24 ENCOUNTER — Encounter: Payer: Self-pay | Admitting: Emergency Medicine

## 2020-06-24 ENCOUNTER — Other Ambulatory Visit: Payer: Self-pay

## 2020-06-24 DIAGNOSIS — J029 Acute pharyngitis, unspecified: Secondary | ICD-10-CM | POA: Insufficient documentation

## 2020-06-24 DIAGNOSIS — Z113 Encounter for screening for infections with a predominantly sexual mode of transmission: Secondary | ICD-10-CM | POA: Insufficient documentation

## 2020-06-24 LAB — POCT RAPID STREP A (OFFICE): Rapid Strep A Screen: NEGATIVE

## 2020-06-24 MED ORDER — DEXAMETHASONE SODIUM PHOSPHATE 10 MG/ML IJ SOLN
10.0000 mg | Freq: Once | INTRAMUSCULAR | Status: AC
  Administered 2020-06-24: 10 mg via INTRAMUSCULAR

## 2020-06-24 MED ORDER — IBUPROFEN 600 MG PO TABS: 600.0000 mg | ORAL_TABLET | Freq: Four times a day (QID) | ORAL | 0 refills | Status: AC | PRN

## 2020-06-24 NOTE — Discharge Instructions (Addendum)
your rapid strep was negative today, so we have sent off a throat culture.  We will contact you and call in the appropriate antibiotics if your culture comes back positive for an infection requiring antibiotic treatment.  Give [place] a working phone number.  If you were given a prescription for antibiotics, you may want to wait and fill it until you know the results of the culture.  1 gram of Tylenol and 600 mg ibuprofen together 3-4 times a day as needed for pain.  Make sure you drink plenty of extra fluids.  Some people find salt water gargles and  Traditional Medicinal's "Throat Coat" tea helpful. Take 5 mL of liquid Benadryl and 5 mL of Maalox. Mix it together, and then hold it in your mouth for as long as you can and then swallow. You may do this 4 times a day.    Your STD testing will be back in 3 to 4 days.  No intercourse until you know what your labs are and you and your partner are treated if necessary  Go to www.goodrx.com to look up your medications. This will give you a list of where you can find your prescriptions at the most affordable prices. Or ask the pharmacist what the cash price is, or if they have any other discount programs available to help make your medication more affordable. This can be less expensive than what you would pay with insurance.

## 2020-06-24 NOTE — ED Triage Notes (Signed)
Pt c/o sore throat with pain in bilateral ears x 3 days; pt also requesting STD testing denies sx

## 2020-06-24 NOTE — ED Provider Notes (Signed)
HPI  SUBJECTIVE:  Patient with 2 issues today:  First, patient reports sore throat starting 5 days ago. Sx worse with swallowing, eating.  Sx better with nothing has been taking DayQuil, NyQuil, turmeric w/ o relief.  No fever + "White balls" on both tonsils + Swollen neck glands   No neck stiffness  No Cough + nasal congestion, rhinorrhea, postnasal drip No Myalgias No Headache No Rash  No loss of taste or smell No shortness of breath or difficulty breathing No nausea, vomiting No diarrhea No abdominal pain     No Recent Strep, mono, COVID exposure No reflux sxs No Allergy sxs  No Breathing difficulty, voice changes, sensation of throat swelling shut No Drooling No Trismus No abx in past month.  Got the second Moderna vaccine in August 21. No antipyretic in past 4-6 hrs .   Second, she is requesting STD testing.  She is in a new relationship with a female partner, states that they use condoms regularly.  She is completely asymptomatic.  No urinary, GU complaints, abdominal, back pain.  Past medical history of gonorrhea, chlamydia, HSV, BV, yeast, frequent strep as a child.  No history of HIV, syphilis, trichomonas, diabetes, allergies, GERD, mono.  LMP: 2/25.  Denies possibility being pregnant.  [id number], Patsy Lager, FNP    Past Medical History:  Diagnosis Date  . Genital herpes   . History of chlamydia   . History of gonorrhea   . Obesity   . PMDD (premenstrual dysphoric disorder)     History reviewed. No pertinent surgical history.  Family History  Problem Relation Age of Onset  . Diabetes Father   . Hyperlipidemia Father   . Hypertension Father   . Edema Mother   . Healthy Sister   . Healthy Brother   . Healthy Sister   . Neurologic Disorder Paternal Grandfather     Social History   Tobacco Use  . Smoking status: Never Smoker  . Smokeless tobacco: Never Used  Vaping Use  . Vaping Use: Never used  Substance Use Topics  . Alcohol use: Yes  .  Drug use: Yes    Types: Marijuana    No current facility-administered medications for this encounter.  Current Outpatient Medications:  .  ibuprofen (ADVIL) 600 MG tablet, Take 1 tablet (600 mg total) by mouth every 6 (six) hours as needed., Disp: 30 tablet, Rfl: 0 .  buPROPion (WELLBUTRIN XL) 300 MG 24 hr tablet, Take 1 tablet (300 mg total) by mouth daily., Disp: 30 tablet, Rfl: 0 .  Calcium Carbonate-Vitamin D (OYSTER SHELL CALCIUM/D) 250-125 MG-UNIT TABS, Take 1 tablet by mouth daily., Disp: , Rfl:  .  fluticasone (FLONASE) 50 MCG/ACT nasal spray, Place 2 sprays into both nostrils daily., Disp: 16 g, Rfl: 0 .  metroNIDAZOLE (FLAGYL) 500 MG tablet, Take 500 mg by mouth 2 (two) times daily. (Patient not taking: Reported on 06/24/2020), Disp: , Rfl:  .  valACYclovir (VALTREX) 500 MG tablet, Take by mouth., Disp: , Rfl:   No Known Allergies   ROS  As noted in HPI.   Physical Exam  BP 126/74 (BP Location: Left Arm)   Pulse 92   Temp 98.3 F (36.8 C) (Oral)   Resp 20   SpO2 98%   Constitutional: Well developed, well nourished, no acute distress Eyes:  EOMI, conjunctiva normal bilaterally HENT: Normocephalic, atraumatic,mucus membranes moist. + mild nasal congestion + erythematous oropharynx +  enlarged, almost kissing tonsils +  exudates.  Foul breath.  Uvula midline.  No drooling, trismus, neck stiffness, muffled voice. Respiratory: Normal inspiratory effort Cardiovascular: Normal rate, no murmurs, rubs, gallops GI: nondistended, nontender. No appreciable splenomegaly skin: No rash, skin intact Lymph: + Anterior cervical LN.  No posterior cervical lymphadenopathy Musculoskeletal: no deformities Neurologic: Alert & oriented x 3, no focal neuro deficits Psychiatric: Speech and behavior appropriate.  ED Course   Medications  dexamethasone (DECADRON) injection 10 mg (10 mg Intramuscular Given 06/24/20 1850)    Orders Placed This Encounter  Procedures  . Culture, group A  strep    Standing Status:   Standing    Number of Occurrences:   1  . Novel Coronavirus, NAA ([facility])    Standing Status:   Standing    Number of Occurrences:   1  . POCT rapid strep A    Standing Status:   Standing    Number of Occurrences:   1    Results for orders placed or performed during the hospital encounter of 06/24/20 (from the past 24 hour(s))  POCT rapid strep A     Status: None   Collection Time: 06/24/20  6:32 PM  Result Value Ref Range   Rapid Strep A Screen Negative Negative   No results found.  ED Clinical Impression  1. Exudative pharyngitis   2. Screening for STD (sexually transmitted disease)      ED Assessment/Plan  1.  Exudative pharyngitis.  COVID sent.  She was given dexamethasone 10 mg IM x1 here because of the extensive tonsillar swelling.  Her airway is patent.  No evidence of impending airway obstruction.  Rapid strep negative. Obtaining throat culture to guide antibiotic treatment. Discussed this with patient. We'll contact her if culture is positive, and will call in Appropriate antibiotics. Patient home with ibuprofen, Tylenol, Benadryl/Maalox mixture.. Patient to followup with PMD when necessary.  2.  STD testing.  Patient completely asymptomatic.  Swab for gonorrhea, chlamydia, trichomonas sent.  will Treat based on labs  Discussed labs,  MDM, plan and followup with patient. Discussed sn/sx that should prompt return to the ED. patient agrees with plan.   Meds ordered this encounter  Medications  . dexamethasone (DECADRON) injection 10 mg  . ibuprofen (ADVIL) 600 MG tablet    Sig: Take 1 tablet (600 mg total) by mouth every 6 (six) hours as needed.    Dispense:  30 tablet    Refill:  0     *This clinic note was created using Scientist, clinical (histocompatibility and immunogenetics). Therefore, there may be occasional mistakes despite careful proofreading.     Domenick Gong, MD 06/25/20 [phone]

## 2020-06-25 LAB — NOVEL CORONAVIRUS, NAA: SARS-CoV-2, NAA: NOT DETECTED

## 2020-06-25 LAB — SARS-COV-2, NAA 2 DAY TAT

## 2020-06-26 LAB — CERVICOVAGINAL ANCILLARY ONLY
Chlamydia: NEGATIVE
Comment: NEGATIVE
Comment: NEGATIVE
Comment: NORMAL
Neisseria Gonorrhea: NEGATIVE
Trichomonas: NEGATIVE

## 2020-06-27 LAB — CULTURE, GROUP A STREP (THRC)

## 2020-10-16 ENCOUNTER — Ambulatory Visit
Admission: EM | Admit: 2020-10-16 | Discharge: 2020-10-16 | Disposition: A | Discharge status: Home / Self Care | Payer: BC Managed Care – PPO | Attending: Nurse Practitioner | Admitting: Nurse Practitioner

## 2020-10-16 DIAGNOSIS — N760 Acute vaginitis: Secondary | ICD-10-CM | POA: Diagnosis present

## 2020-10-16 LAB — POCT URINALYSIS DIP (MANUAL ENTRY)
Bilirubin, UA: NEGATIVE
Blood, UA: NEGATIVE
Glucose, UA: NEGATIVE mg/dL
Ketones, POC UA: NEGATIVE mg/dL
Leukocytes, UA: NEGATIVE
Nitrite, UA: NEGATIVE
Protein Ur, POC: NEGATIVE mg/dL
Spec Grav, UA: 1.02 (ref 1.010–1.025)
Urobilinogen, UA: 0.2 E.U./dL
pH, UA: 8.5 — AB (ref 5.0–8.0)

## 2020-10-16 LAB — POCT URINE PREGNANCY: Preg Test, Ur: NEGATIVE

## 2020-10-16 MED ORDER — FLUCONAZOLE 150 MG PO TABS: 150.0000 mg | ORAL_TABLET | ORAL | 0 refills | Status: AC

## 2020-10-16 MED ORDER — METRONIDAZOLE 500 MG PO TABS: 500.0000 mg | ORAL_TABLET | Freq: Two times a day (BID) | ORAL | 0 refills | Status: AC

## 2020-10-16 MED ORDER — CEFTRIAXONE SODIUM 500 MG IJ SOLR
500.0000 mg | Freq: Once | INTRAMUSCULAR | Status: AC
  Administered 2020-10-16: 500 mg via INTRAMUSCULAR

## 2020-10-16 NOTE — ED Provider Notes (Signed)
[facility]    CSN: [id number] Arrival date & time: 10/16/20  1207      History   Chief Complaint Chief Complaint  Patient presents with   Vaginal Discharge   vaginal irritation    HPI Kelly Mcdaniel is a 25 y.o. female.   Subjective:   Kelly Mcdaniel is a 25 y.o. female who presents for evaluation of vaginal discharge that is white, thick, and malodorous. Symptoms have been present for the past 3 days. Symptoms also include local irritation and vulvar itching. She denies any fevers, nausea, vomiting, abdominal pain, dysuria, urinary frequency, flank pain or back pain.  Menstrual pattern: bleeding regularly. LMP 09/27/20. Contraception: none. She is sexually active with one female partner. STI Risk: Possible STD exposure. Patient has history of chlamydia in the past.   The following portions of the patient's history were reviewed and updated as appropriate: allergies, current medications, past family history, past medical history, past social history, past surgical history, and problem list.    Past Medical History:  Diagnosis Date   Genital herpes    History of chlamydia    History of gonorrhea    Obesity    PMDD (premenstrual dysphoric disorder)     There are no problems to display for this patient.   History reviewed. No pertinent surgical history.  OB History   No obstetric history on file.      Home Medications    Prior to Admission medications   Medication Sig Start Date End Date Taking? Authorizing Provider  fluconazole (DIFLUCAN) 150 MG tablet Take 1 tablet (150 mg total) by mouth every 3 (three) days for 2 doses. 10/16/20 10/20/20 Yes Lurline Idol, FNP  metroNIDAZOLE (FLAGYL) 500 MG tablet Take 1 tablet (500 mg total) by mouth 2 (two) times daily. 10/16/20  Yes Lurline Idol, FNP  buPROPion (WELLBUTRIN XL) 300 MG 24 hr tablet Take 1 tablet (300 mg total) by mouth daily. 02/01/19   Hoy Register, MD  Calcium Carbonate-Vitamin D  (OYSTER SHELL CALCIUM/D) 250-125 MG-UNIT TABS Take 1 tablet by mouth daily.    [provider]  fluticasone (FLONASE) 50 MCG/ACT nasal spray Place 2 sprays into both nostrils daily. 01/22/20   Domenick Gong, MD  ibuprofen (ADVIL) 600 MG tablet Take 1 tablet (600 mg total) by mouth every 6 (six) hours as needed. 06/24/20   Domenick Gong, MD  valACYclovir (VALTREX) 500 MG tablet Take by mouth. 10/18/19   [provider]  ipratropium (ATROVENT) 0.03 % nasal spray Place 2 sprays into both nostrils 2 (two) times daily. 04/11/18 01/22/20  Bing Neighbors, FNP    Family History Family History  Problem Relation Age of Onset   Diabetes Father    Hyperlipidemia Father    Hypertension Father    Edema Mother    Healthy Sister    Healthy Brother    Healthy Sister    Neurologic Disorder Paternal Grandfather     Social History Social History   Tobacco Use   Smoking status: Never   Smokeless tobacco: Never  Vaping Use   Vaping Use: Never used  Substance Use Topics   Alcohol use: Yes   Drug use: Yes    Types: Marijuana     Allergies   Patient has no known allergies.   Review of Systems Review of Systems  Constitutional:  Negative for fever.  Gastrointestinal:  Negative for nausea and vomiting.  Genitourinary:  Positive for vaginal discharge. Negative for dysuria, flank pain, frequency, menstrual problem and  pelvic pain.  Musculoskeletal:  Negative for back pain.  All other systems reviewed and are negative.   Physical Exam Triage Vital Signs ED Triage Vitals  Enc Vitals Group     BP 10/16/20 1330 99/73     Pulse Rate 10/16/20 1330 89     Resp 10/16/20 1330 18     Temp 10/16/20 1330 98.4 F (36.9 C)     Temp Source 10/16/20 1330 Oral     SpO2 10/16/20 1330 95 %     Weight --      Height --      Head Circumference --      Peak Flow --      Pain Score 10/16/20 1334 0     Pain Loc --      Pain Edu? --      Excl. in GC? --    No data  found.  Updated Vital Signs BP 99/73 (BP Location: Left Arm)   Pulse 89   Temp 98.4 F (36.9 C) (Oral)   Resp 18   LMP 09/27/2020 (Exact Date)   SpO2 95%   Visual Acuity Right Eye Distance:   Left Eye Distance:   Bilateral Distance:    Right Eye Near:   Left Eye Near:    Bilateral Near:     Physical Exam Vitals reviewed.  Constitutional:      Appearance: Normal appearance.  HENT:     Head: Normocephalic.  Cardiovascular:     Rate and Rhythm: Normal rate and regular rhythm.  Pulmonary:     Effort: Pulmonary effort is normal.     Breath sounds: Normal breath sounds.  Abdominal:     Palpations: Abdomen is soft.  Musculoskeletal:        General: Normal range of motion.     Cervical back: Normal range of motion and neck supple.  Skin:    General: Skin is warm and dry.  Neurological:     General: No focal deficit present.     Mental Status: She is alert and oriented to person, place, and time.     UC Treatments / Results  Labs (all labs ordered are listed, but only abnormal results are displayed) Labs Reviewed  POCT URINALYSIS DIP (MANUAL ENTRY) - Abnormal; Notable for the following components:      Result Value   pH, UA 8.5 (*)    All other components within normal limits  POCT URINE PREGNANCY  CERVICOVAGINAL ANCILLARY ONLY    EKG   Radiology No results found.  Procedures Procedures (including critical care time)  Medications Ordered in UC Medications  cefTRIAXone (ROCEPHIN) injection 500 mg (has no administration in time range)    Initial Impression / Assessment and Plan / UC Course  I have reviewed the triage vital signs and the nursing notes.  Pertinent labs & imaging results that were available during my care of the patient were reviewed by me and considered in my medical decision making (see chart for details).     25 year old female presenting with vaginal discharge, local irritation and vulvar itching.  No fevers, nausea, vomiting,  abdominal pain, dysuria, urinary frequency, flank pain or back pain.  She is sexually active and does not use any condoms.  She is concerned for possible STIs as well. UA unremarkable.  Urine pregnancy negative.   Plan: Flagyl twice daily x7 days Diflucan every 72 hours x2 doses Educational materials distributed. Abstinence from intercourse discussed. Partner notification discussed. Discussed safe sex.  Today's evaluation  has revealed no signs of a dangerous process. Discussed diagnosis with patient and/or guardian. Patient and/or guardian aware of their diagnosis, possible red flag symptoms to watch out for and need for close follow up. Patient and/or guardian understands verbal and written discharge instructions. Patient and/or guardian comfortable with plan and disposition.  Patient and/or guardian has a clear mental status at this time, good insight into illness (after discussion and teaching) and has clear judgment to make decisions regarding their care  This care was provided during an unprecedented National Emergency due to the Novel Coronavirus (COVID-19) pandemic. COVID-19 infections and transmission risks place heavy strains on healthcare resources.  As this pandemic evolves, our facility, providers, and staff strive to respond fluidly, to remain operational, and to provide care relative to available resources and information. Outcomes are unpredictable and treatments are without well-defined guidelines. Further, the impact of COVID-19 on all aspects of urgent care, including the impact to patients seeking care for reasons other than COVID-19, is unavoidable during this national emergency. At this time of the global pandemic, management of patients has significantly changed, even for non-COVID positive patients given high local and regional COVID volumes at this time requiring high healthcare system and resource utilization. The standard of care for management of both COVID suspected and  non-COVID suspected patients continues to change rapidly at the local, regional, national, and global levels. This patient was worked up and treated to the best available but ever changing evidence and resources available at this current time.   Documentation was completed with the aid of voice recognition software. Transcription may contain typographical errors.   Final Clinical Impressions(s) / UC Diagnoses   Final diagnoses:  Vaginitis and vulvovaginitis   Discharge Instructions   None    ED Prescriptions     Medication Sig Dispense Auth. Provider   metroNIDAZOLE (FLAGYL) 500 MG tablet Take 1 tablet (500 mg total) by mouth 2 (two) times daily. 14 tablet Lurline Idol, FNP   fluconazole (DIFLUCAN) 150 MG tablet Take 1 tablet (150 mg total) by mouth every 3 (three) days for 2 doses. 2 tablet Lurline Idol, FNP      PDMP not reviewed this encounter.   Lurline Idol, [place] 10/16/20 [phone]

## 2020-10-16 NOTE — ED Triage Notes (Signed)
Three days of vaginal irritation and onset today of vaginal discharge. Confirms vaginal odor.  Denies hematuria and dysuria. No urinary frequency or urgency. No abdominal pain.

## 2020-10-17 LAB — CERVICOVAGINAL ANCILLARY ONLY
Bacterial Vaginitis (gardnerella): POSITIVE — AB
Chlamydia: POSITIVE — AB
Comment: NEGATIVE
Comment: NEGATIVE
Comment: NEGATIVE
Comment: NORMAL
Neisseria Gonorrhea: NEGATIVE
Trichomonas: NEGATIVE

## 2020-10-20 ENCOUNTER — Telehealth: Payer: Self-pay | Admitting: Emergency Medicine

## 2020-10-20 MED ORDER — DOXYCYCLINE HYCLATE 100 MG PO CAPS: 100.0000 mg | ORAL_CAPSULE | Freq: Two times a day (BID) | ORAL | 0 refills | Status: AC
